# Patient Record
Sex: Female | Born: 1965 | Race: Black or African American | Hispanic: No | Marital: Married | Smoking: Never smoker
Health system: Southern US, Community
[De-identification: ages and names within clinical notes are randomized; demographics above are authoritative.]

## PROBLEM LIST (undated history)

## (undated) DIAGNOSIS — I10 Essential (primary) hypertension: Secondary | ICD-10-CM

## (undated) DIAGNOSIS — M1990 Unspecified osteoarthritis, unspecified site: Secondary | ICD-10-CM

## (undated) HISTORY — PX: OTHER SURGICAL HISTORY: SHX169

---

## 2002-01-23 ENCOUNTER — Encounter: Payer: Self-pay | Admitting: Emergency Medicine

## 2002-01-23 ENCOUNTER — Emergency Department (HOSPITAL_COMMUNITY): Admission: EM | Admit: 2002-01-23 | Discharge: 2002-01-23 | Payer: Self-pay | Admitting: Emergency Medicine

## 2002-01-26 ENCOUNTER — Emergency Department (HOSPITAL_COMMUNITY): Admission: EM | Admit: 2002-01-26 | Discharge: 2002-01-26 | Payer: Self-pay | Admitting: Emergency Medicine

## 2002-06-06 ENCOUNTER — Emergency Department (HOSPITAL_COMMUNITY): Admission: EM | Admit: 2002-06-06 | Discharge: 2002-06-07 | Payer: Self-pay | Admitting: Internal Medicine

## 2002-12-29 ENCOUNTER — Ambulatory Visit (HOSPITAL_COMMUNITY): Admission: RE | Admit: 2002-12-29 | Discharge: 2002-12-29 | Payer: Self-pay | Admitting: Family Medicine

## 2002-12-29 ENCOUNTER — Encounter: Payer: Self-pay | Admitting: Family Medicine

## 2003-01-20 ENCOUNTER — Ambulatory Visit (HOSPITAL_COMMUNITY): Admission: RE | Admit: 2003-01-20 | Discharge: 2003-01-20 | Payer: Self-pay | Admitting: Podiatry

## 2003-03-18 ENCOUNTER — Ambulatory Visit (HOSPITAL_COMMUNITY): Admission: RE | Admit: 2003-03-18 | Discharge: 2003-03-18 | Payer: Self-pay | Admitting: Family Medicine

## 2003-03-18 ENCOUNTER — Encounter: Payer: Self-pay | Admitting: Family Medicine

## 2006-01-28 ENCOUNTER — Ambulatory Visit (HOSPITAL_COMMUNITY): Admission: RE | Admit: 2006-01-28 | Discharge: 2006-01-28 | Payer: Self-pay | Admitting: Family Medicine

## 2013-11-12 ENCOUNTER — Ambulatory Visit (INDEPENDENT_AMBULATORY_CARE_PROVIDER_SITE_OTHER): Payer: Managed Care, Other (non HMO)

## 2013-11-12 ENCOUNTER — Ambulatory Visit (INDEPENDENT_AMBULATORY_CARE_PROVIDER_SITE_OTHER): Payer: Managed Care, Other (non HMO) | Admitting: Orthopedic Surgery

## 2013-11-12 ENCOUNTER — Encounter: Payer: Self-pay | Admitting: Orthopedic Surgery

## 2013-11-12 VITALS — BP 122/84 | Ht 63.0 in | Wt 197.0 lb

## 2013-11-12 DIAGNOSIS — M25519 Pain in unspecified shoulder: Secondary | ICD-10-CM

## 2013-11-12 DIAGNOSIS — M25511 Pain in right shoulder: Secondary | ICD-10-CM

## 2013-11-12 DIAGNOSIS — M778 Other enthesopathies, not elsewhere classified: Secondary | ICD-10-CM | POA: Insufficient documentation

## 2013-11-12 DIAGNOSIS — M67919 Unspecified disorder of synovium and tendon, unspecified shoulder: Secondary | ICD-10-CM

## 2013-11-12 MED ORDER — IBUPROFEN 800 MG PO TABS: 800.0000 mg | ORAL_TABLET | Freq: Three times a day (TID) | ORAL | Status: DC | PRN

## 2013-11-12 MED ORDER — HYDROCODONE-ACETAMINOPHEN 5-325 MG PO TABS: 1.0000 | ORAL_TABLET | Freq: Four times a day (QID) | ORAL | Status: DC | PRN

## 2013-11-12 NOTE — Patient Instructions (Signed)
Rest your shoulder Pick up Ibuprofen at pharmacy Joint Injection  Care After  Refer to this sheet in the next few days. These instructions provide you with information on caring for yourself after you have had a joint injection. Your caregiver also may give you more specific instructions. Your treatment has been planned according to current medical practices, but problems sometimes occur. Call your caregiver if you have any problems or questions after your procedure.  After any type of joint injection, it is not uncommon to experience:  Soreness, swelling, or bruising around the injection site.  Mild numbness, tingling, or weakness around the injection site caused by the numbing medicine used before or with the injection. It also is possible to experience the following effects associated with the specific agent after injection:  Iodine-based contrast agents:  Allergic reaction (itching, hives, widespread redness, and swelling beyond the injection site).  Corticosteroids (These effects are rare.):  Allergic reaction.  Increased blood sugar levels (If you have diabetes and you notice that your blood sugar levels have increased, notify your caregiver).  Increased blood pressure levels.  Mood swings.  Hyaluronic acid in the use of viscosupplementation.  Temporary heat or redness.  Temporary rash and itching.  Increased fluid accumulation in the injected joint. These effects all should resolve within a day after your procedure.  HOME CARE INSTRUCTIONS  Limit yourself to light activity the day of your procedure. Avoid lifting heavy objects, bending, stooping, or twisting.  Take prescription or over-the-counter pain medication as directed by your caregiver.  You may apply ice to your injection site to reduce pain and swelling the day of your procedure. Ice may be applied 3-4 times:  Put ice in a plastic bag.  Place a towel between your skin and the bag.  Leave the ice on for no longer than 15-20  minutes each time. SEEK IMMEDIATE MEDICAL CARE IF:  Pain and swelling get worse rather than better or extend beyond the injection site.  Numbness does not go away.  Blood or fluid continues to leak from the injection site.  You have chest pain.  You have swelling of your face or tongue.  You have trouble breathing or you become dizzy.  You develop a fever, chills, or severe tenderness at the injection site that last longer than 1 day. MAKE SURE YOU:  Understand these instructions.  Watch your condition.  Get help right away if you are not doing well or if you get worse. Document Released: 08/23/2011 Document Revised: 03/03/2012 Document Reviewed: 08/23/2011  [facility] Patient Information 2014 [place], [place].  Rotator Cuff Tendonitis  The rotator cuff is the collection of all the muscles and tendons (the supraspinatus, infraspinatus, subscapularis, and teres minor muscles and their tendons) that help your shoulder stay in place. This unit holds the head of the upper arm bone (humerus) in the cup (fossa) of the shoulder blade (scapula). Basically, it connects the arm to the shoulder. Tendinitis is a swelling and irritation of the tissue, called cord like structures (tendons) that connect muscle to bone. It usually is caused by overusing the joint involved. When the tissue surrounding a tendon (the synovium) becomes inflamed, it is called tenosynovitis. This also is often the result of overuse in people whose jobs require repetitive (over and over again) types of motion. HOME CARE INSTRUCTIONS   Use a sling or splint for as Renn Stille as directed by your caregiver until the pain decreases.  Apply ice to the injury for 15-20 minutes, 03-04 times  per day. Put the ice in a plastic bag and place a towel between the bag of ice and your skin.  Try to avoid use other than gentle range of motion while your shoulder is painful. Use and exercise only as directed by your caregiver. Stop exercises or range of  motion if pain or discomfort increases, unless directed otherwise by your caregiver.  Only take over-the-counter or prescription medicines for pain, discomfort, or fever as directed by your caregiver.  If you were give a shoulder sling and straps (immobilizer), do not remove it except as directed, or until you see a caregiver for a follow-up examination. If you need to remove it, move your arm as little as possible or as directed.  You may want to sleep on several pillows at night to lessen swelling and pain. SEEK IMMEDIATE MEDICAL CARE IF:   Pain in your shoulder increases or new pain develops in your arm, hand, or fingers and is not relieved with medications.  You develop new, unexplained symptoms, especially increased numbness in the hands or loss of strength, or you develop any worsening of the problems which brought you in for care.  Your arm, hand, or fingers are numb or tingling.  Your arm, hand, or fingers are swollen, painful, or turn white or blue. Document Released: 03/01/2004 Document Revised: 03/03/2012 Document Reviewed: 07/22/2013 [facility] Patient Information 2014 [place], [place].

## 2013-11-12 NOTE — Progress Notes (Signed)
Patient ID: Jennifer Ellis, female   DOB: November 18, 1966, 47 y.o.   MRN: [id number]  Chief Complaint  Patient presents with  . 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 Her symptoms started gradually no trauma. She complains a 5/10 constant pain in the right shoulder even at work although there was no work injury. She has more pain in the evening and early in the morning and really bothers her when she settles down for the evening. She does not have numbness or tingling in the arm the patient complains of pain radiating to the elbow but not below the definitive defining it as starting in the shoulder denies neck pain or stiffness  History of constipation muscle pain all other systems were reviewed and were normal  Her medical history is positive for having had a cesarean section no major medical problems or medications social history she is separated does not smoke or drink  She is a well-developed well-nourished female awake alert and oriented x3 mood and affect are normal general appearance is normal she's a mature without assistive devices and there is no evidence of a limp  She has good pulses and normal sensation in both arms with normal lymph lymph nodes bilaterally in the cervical and axillary regions reflexes are 2+ and equal. Left shoulder full range of motion stability tests were normal motor function was normal skin was intact and inspection showed no swelling or tenderness  Right shoulder area acromial tenderness ankle range of motion active and passive with limitations of motion 90 flexion abduction active and 120 flexion with passive range of motion which produces pain and a positive impingement sign with a weak rotator cuff skin is intact and normal  X-ray was negative for fracture or calcification.  Impression rotator cuff  tendinitis  Recommend Anti-inflammatory Motrin 800 mg 3 times a day Analgesic Norco 5 mg every 6 when necessary pain #30 Right subacromial injection I advised 10 days of rest with the patient suggested at work she will get some rest over the Thanksgiving break  Followup as needed  Shoulder Injection Procedure Note   Pre-operative Diagnosis: right  RC Syndrome  Post-operative Diagnosis: same  Indications: pain   Anesthesia: ethyl chloride   Procedure Details   Verbal consent was obtained for the procedure. The shoulder was prepped withalcohol and the skin was anesthetized. A 20 gauge needle was advanced into the subacromial space through posterior approach without difficulty  The space was then injected with 3 ml 1% lidocaine and 1 ml of depomedrol. The injection site was cleansed with isopropyl alcohol and a dressing was applied.  Complications:  None; patient tolerated the procedure well.

## 2014-08-06 ENCOUNTER — Emergency Department (HOSPITAL_COMMUNITY)
Admission: EM | Admit: 2014-08-06 | Discharge: 2014-08-06 | Disposition: A | Discharge status: Home / Self Care | Payer: Managed Care, Other (non HMO) | Attending: Emergency Medicine | Admitting: Emergency Medicine

## 2014-08-06 ENCOUNTER — Encounter (HOSPITAL_COMMUNITY): Payer: Self-pay | Admitting: Emergency Medicine

## 2014-08-06 DIAGNOSIS — R110 Nausea: Secondary | ICD-10-CM | POA: Diagnosis not present

## 2014-08-06 DIAGNOSIS — R1084 Generalized abdominal pain: Secondary | ICD-10-CM | POA: Insufficient documentation

## 2014-08-06 DIAGNOSIS — Z3202 Encounter for pregnancy test, result negative: Secondary | ICD-10-CM | POA: Diagnosis not present

## 2014-08-06 LAB — WET PREP, GENITAL
Trich, Wet Prep: NONE SEEN
WBC WET PREP: NONE SEEN
YEAST WET PREP: NONE SEEN

## 2014-08-06 LAB — CBC WITH DIFFERENTIAL/PLATELET
BASOS ABS: 0.1 10*3/uL (ref 0.0–0.1)
BASOS PCT: 1 % (ref 0–1)
Eosinophils Absolute: 0.3 10*3/uL (ref 0.0–0.7)
Eosinophils Relative: 5 % (ref 0–5)
HCT: 36.5 % (ref 36.0–46.0)
Hemoglobin: 12.1 g/dL (ref 12.0–15.0)
LYMPHS PCT: 34 % (ref 12–46)
Lymphs Abs: 2.4 10*3/uL (ref 0.7–4.0)
MCH: 29.9 pg (ref 26.0–34.0)
MCHC: 33.2 g/dL (ref 30.0–36.0)
MCV: 90.1 fL (ref 78.0–100.0)
Monocytes Absolute: 0.5 10*3/uL (ref 0.1–1.0)
Monocytes Relative: 7 % (ref 3–12)
NEUTROS ABS: 3.8 10*3/uL (ref 1.7–7.7)
Neutrophils Relative %: 53 % (ref 43–77)
PLATELETS: 277 10*3/uL (ref 150–400)
RBC: 4.05 MIL/uL (ref 3.87–5.11)
RDW: 12.9 % (ref 11.5–15.5)
WBC: 7.1 10*3/uL (ref 4.0–10.5)

## 2014-08-06 LAB — URINALYSIS, ROUTINE W REFLEX MICROSCOPIC
Bilirubin Urine: NEGATIVE
GLUCOSE, UA: NEGATIVE mg/dL
Hgb urine dipstick: NEGATIVE
KETONES UR: NEGATIVE mg/dL
LEUKOCYTES UA: NEGATIVE
NITRITE: NEGATIVE
PH: 6 (ref 5.0–8.0)
Protein, ur: NEGATIVE mg/dL
SPECIFIC GRAVITY, URINE: 1.022 (ref 1.005–1.030)
Urobilinogen, UA: 0.2 mg/dL (ref 0.0–1.0)

## 2014-08-06 LAB — PREGNANCY, URINE: PREG TEST UR: NEGATIVE

## 2014-08-06 LAB — COMPREHENSIVE METABOLIC PANEL
ALBUMIN: 3.7 g/dL (ref 3.5–5.2)
ALT: 12 U/L (ref 0–35)
AST: 15 U/L (ref 0–37)
Alkaline Phosphatase: 70 U/L (ref 39–117)
Anion gap: 11 (ref 5–15)
BUN: 14 mg/dL (ref 6–23)
CHLORIDE: 104 meq/L (ref 96–112)
CO2: 28 meq/L (ref 19–32)
Calcium: 9.5 mg/dL (ref 8.4–10.5)
Creatinine, Ser: 0.85 mg/dL (ref 0.50–1.10)
GFR calc Af Amer: >90 mL/min (ref >90)
GFR calc non Af Amer: 81 mL/min — ABNORMAL LOW (ref >90)
Glucose, Bld: 89 mg/dL (ref 70–99)
Potassium: 4.1 mEq/L (ref 3.7–5.3)
SODIUM: 143 meq/L (ref 137–147)
Total Bilirubin: 0.3 mg/dL (ref 0.3–1.2)
Total Protein: 7.8 g/dL (ref 6.0–8.3)

## 2014-08-06 LAB — LIPASE, BLOOD: Lipase: 23 U/L (ref 11–59)

## 2014-08-06 MED ORDER — OXYCODONE-ACETAMINOPHEN 5-325 MG PO TABS
1.0000 | ORAL_TABLET | Freq: Once | ORAL | Status: AC
  Administered 2014-08-06: 1 via ORAL
  Filled 2014-08-06: qty 1

## 2014-08-06 MED ORDER — GI COCKTAIL ~~LOC~~
30.0000 mL | Freq: Once | ORAL | Status: AC
  Administered 2014-08-06: 30 mL via ORAL
  Filled 2014-08-06: qty 30

## 2014-08-06 NOTE — ED Provider Notes (Signed)
CSN: [id number]     Arrival date & time 08/06/14  1734 History   First MD Initiated Contact with Patient 08/06/14 2044     Chief Complaint  Patient presents with  . Abdominal Pain   Jennifer Ellis is a 48 yo AAF w/PMH of 3 C-sxns who presents today with complaints of generalized abdominal pain and decreased appetite. Abdominal pain is located in the middle of her stomach and feels like a knot. Last bowel movement was yesterday and was normal. No diarrhea, no dark tarry stool. Since then has been constant today, moderate in severity, no radiation. She also has lower abdominal pain above the inguinal areas bilaterally. Patient has had decreased appetite but no nausea or vomiting. Able to eat and drink normally. Denies any urinary symptoms. No history of STI's. Patient says she's had a little bit of acid reflux in the past  She denies CP, SOB, fever, chills, V, diarrhea, constipation, hematemesis, dysuria, hematuria, sick contacts, or recent travel.   (Consider location/radiation/quality/duration/timing/severity/associated sxs/prior Treatment) Patient is a 48 y.o. female presenting with abdominal pain. The history is provided by the patient.  Abdominal Pain Pain location:  Periumbilical Pain quality comment:  Knot-like Pain radiates to:  Does not radiate Pain severity:  Moderate Onset quality:  Sudden Duration:  1 day Timing:  Constant Progression:  Unchanged Chronicity:  New Context: not alcohol use, not diet changes, not retching, not sick contacts and not suspicious food intake   Relieved by:  Nothing Worsened by:  Nothing tried Ineffective treatments:  None tried Associated symptoms: no belching, no chest pain, no chills, no constipation, no diarrhea, no dysuria, no fatigue, no fever, no hematemesis, no hematochezia, no hematuria, no melena, no nausea, no shortness of breath, no vaginal bleeding, no vaginal discharge and no vomiting     History reviewed. No pertinent past medical  history. History reviewed. No pertinent past surgical history. No family history on file. History  Substance Use Topics  . Smoking status: Never Smoker   . Smokeless tobacco: Not on file  . Alcohol Use: No   OB History   Grav Para Term Preterm Abortions TAB SAB Ect Mult Living                 Review of Systems  Constitutional: Negative for fever, chills and fatigue.  Respiratory: Negative for shortness of breath.   Cardiovascular: Negative for chest pain, palpitations and leg swelling.  Gastrointestinal: Positive for abdominal pain. Negative for nausea, vomiting, diarrhea, constipation, blood in stool, melena, hematochezia, abdominal distention, rectal pain and hematemesis.  Genitourinary: Negative for dysuria, frequency, hematuria, flank pain, decreased urine volume, vaginal bleeding, vaginal discharge, difficulty urinating, genital sores, vaginal pain, menstrual problem and pelvic pain.  Neurological: Negative for dizziness, speech difficulty, light-headedness and headaches.  All other systems reviewed and are negative.     Allergies  Review of patient's allergies indicates no known allergies.  Home Medications   Prior to Admission medications   Medication Sig Start Date End Date Taking? Authorizing Provider  ibuprofen (ADVIL,MOTRIN) 200 MG tablet Take 400 mg by mouth every 6 (six) hours as needed for moderate pain.   Yes Historical Provider, MD   BP 154/91  Pulse 64  Temp(Src) 98.7 F (37.1 C) (Oral)  Resp 18  Ht 5\' 3"  (1.6 m)  Wt 204 lb (92.534 kg)  BMI 36.15 kg/m2  SpO2 98%  LMP 05/06/2014 Physical Exam  Nursing note and vitals reviewed. Constitutional: She appears well-developed and well-nourished. No distress.  HENT:  Head: Normocephalic and atraumatic.  Cardiovascular: Normal rate, regular rhythm, normal heart sounds and intact distal pulses.  Exam reveals no gallop and no friction rub.   No murmur heard. Pulmonary/Chest: Effort normal and breath sounds  normal. No respiratory distress. She has no wheezes. She has no rales. She exhibits no tenderness.  Abdominal: Soft. Bowel sounds are normal. She exhibits no distension and no mass. There is tenderness (mild, epigastric, periumbilica, and lower abd). There is no rebound and no guarding.  Lymphadenopathy:    She has no cervical adenopathy.  Skin: Skin is warm and dry. She is not diaphoretic.    ED Course  Procedures (including critical care time) Labs Review Labs Reviewed  WET PREP, GENITAL - Abnormal; Notable for the following:    Clue Cells Wet Prep HPF POC FEW (*)    All other components within normal limits  COMPREHENSIVE METABOLIC PANEL - Abnormal; Notable for the following:    GFR calc non Af Amer 81 (*)    All other components within normal limits  URINALYSIS, ROUTINE W REFLEX MICROSCOPIC - Abnormal; Notable for the following:    APPearance CLOUDY (*)    All other components within normal limits  GC/CHLAMYDIA PROBE AMP  CBC WITH DIFFERENTIAL  LIPASE, BLOOD  PREGNANCY, URINE    Imaging Review No results found.   EKG Interpretation None      MDM   48 yo AAF w/abd pain. Please see HPI for details. On exam, pt in NAD, AFVSS. Abdominal exam very benign with generalized tenderness located mostly in the epigastric, periumbilical, lower abdomen. Will obtain CBC, CMP, UA, pelvic exam with wet prep and GC/Chlamydia, lipase, UA. Patient given GI cocktail and Percocet for pain. DDx includes bowel gas, GERD, UTI, and less likely, pancreatitis or ectopic pregnancy. History and exam not consistent with ovarian torsion, cholecystitis, appendicitis, kidney stone, or SBO. Therefore no imaging obtained.  Pelvic exam shows physiologic discharge, no CMT tenderness, no masses and adnexa area. CBC, CMP, UA within normal limits with no signs of infection. Lipase negative for pancreatitis.  Wet prep returned with only a few clue cells but otherwise normal. GC/chlamydia still pending. At this  time patient feels comfortable going home. Pain well-controlled after Percocet. She was given a few pills of Norco and Zofran for over the weekend. Encouraged her to follow up PCP next week. Strict return precautions include severe pain, especially if accompanied with severe nausea/vomiting, and PO intolerance.  Final diagnoses:  Abdominal pain, generalized  Nausea    Pt was seen under the supervision of Dr. Rubin Payor.     Rachelle Hora, MD 08/07/14 [phone]

## 2014-08-06 NOTE — ED Notes (Signed)
abd pain and swelling since yesterday no nv or diarrhea.  lmp may

## 2014-08-06 NOTE — ED Notes (Signed)
Pt reports she began having upper abdominal pain since at work yesterday.  "feels like I got a knot right there"   Also reports onset of heartburn lately which is not normal for her.  Denies n/v/d.

## 2014-08-06 NOTE — Discharge Instructions (Signed)
Abdominal Pain, Women °Abdominal (stomach, pelvic, or belly) pain can be caused by many things. It is important to tell your doctor: °· The location of the pain. °· Does it come and go or is it present all the time? °· Are there things that start the pain (eating certain foods, exercise)? °· Are there other symptoms associated with the pain (fever, nausea, vomiting, diarrhea)? °All of this is helpful to know when trying to find the cause of the pain. °CAUSES  °· Stomach: virus or bacteria infection, or ulcer. °· Intestine: appendicitis (inflamed appendix), regional ileitis (Crohn's disease), ulcerative colitis (inflamed colon), irritable bowel syndrome, diverticulitis (inflamed diverticulum of the colon), or cancer of the stomach or intestine. °· Gallbladder disease or stones in the gallbladder. °· Kidney disease, kidney stones, or infection. °· Pancreas infection or cancer. °· Fibromyalgia (pain disorder). °· Diseases of the female organs: °¨ Uterus: fibroid (non-cancerous) tumors or infection. °¨ Fallopian tubes: infection or tubal pregnancy. °¨ Ovary: cysts or tumors. °¨ Pelvic adhesions (scar tissue). °¨ Endometriosis (uterus lining tissue growing in the pelvis and on the pelvic organs). °¨ Pelvic congestion syndrome (female organs filling up with blood just before the menstrual period). °¨ Pain with the menstrual period. °¨ Pain with ovulation (producing an egg). °¨ Pain with an IUD (intrauterine device, birth control) in the uterus. °¨ Cancer of the female organs. °· Functional pain (pain not caused by a disease, may improve without treatment). °· Psychological pain. °· Depression. °DIAGNOSIS  °Your doctor will decide the seriousness of your pain by doing an examination. °· Blood tests. °· X-rays. °· Ultrasound. °· CT scan (computed tomography, special type of X-ray). °· MRI (magnetic resonance imaging). °· Cultures, for infection. °· Barium enema (dye inserted in the large intestine, to better view it with  X-rays). °· Colonoscopy (looking in intestine with a lighted tube). °· Laparoscopy (minor surgery, looking in abdomen with a lighted tube). °· Major abdominal exploratory surgery (looking in abdomen with a large incision). °TREATMENT  °The treatment will depend on the cause of the pain.  °· Many cases can be observed and treated at home. °· Over-the-counter medicines recommended by your caregiver. °· Prescription medicine. °· Antibiotics, for infection. °· Birth control pills, for painful periods or for ovulation pain. °· Hormone treatment, for endometriosis. °· Nerve blocking injections. °· Physical therapy. °· Antidepressants. °· Counseling with a psychologist or psychiatrist. °· Minor or major surgery. °HOME CARE INSTRUCTIONS  °· Do not take laxatives, unless directed by your caregiver. °· Take over-the-counter pain medicine only if ordered by your caregiver. Do not take aspirin because it can cause an upset stomach or bleeding. °· Try a clear liquid diet (broth or water) as ordered by your caregiver. Slowly move to a bland diet, as tolerated, if the pain is related to the stomach or intestine. °· Have a thermometer and take your temperature several times a day, and record it. °· Bed rest and sleep, if it helps the pain. °· Avoid sexual intercourse, if it causes pain. °· Avoid stressful situations. °· Keep your follow-up appointments and tests, as your caregiver orders. °· If the pain does not go away with medicine or surgery, you may try: °¨ Acupuncture. °¨ Relaxation exercises (yoga, meditation). °¨ Group therapy. °¨ Counseling. °SEEK MEDICAL CARE IF:  °· You notice certain foods cause stomach pain. °· Your home care treatment is not helping your pain. °· You need stronger pain medicine. °· You want your IUD removed. °· You feel faint or   lightheaded. °· You develop nausea and vomiting. °· You develop a rash. °· You are having side effects or an allergy to your medicine. °SEEK IMMEDIATE MEDICAL CARE IF:  °· Your  pain does not go away or gets worse. °· You have a fever. °· Your pain is felt only in portions of the abdomen. The right side could possibly be appendicitis. The left lower portion of the abdomen could be colitis or diverticulitis. °· You are passing blood in your stools (bright red or black tarry stools, with or without vomiting). °· You have blood in your urine. °· You develop chills, with or without a fever. °· You pass out. °MAKE SURE YOU:  °· Understand these instructions. °· Will watch your condition. °· Will get help right away if you are not doing well or get worse. °Document Released: 10/07/2007 Document Revised: 04/26/2014 Document Reviewed: 10/27/2009 °[employer]® Patient Information ©2015 [employer]. This information is not intended to replace advice given to you by your health care provider. Make sure you discuss any questions you have with your health care provider. ° °

## 2014-08-07 LAB — GC/CHLAMYDIA PROBE AMP
CT PROBE, AMP APTIMA: NEGATIVE
GC PROBE AMP APTIMA: NEGATIVE

## 2014-08-07 NOTE — ED Provider Notes (Signed)
I saw and evaluated the patient, reviewed the resident's note and I agree with the findings and plan.   EKG Interpretation None     Patient abdominal pain. Lab work reassuring. Doubt severe pathology at this time. Will discharge  Juliet Rudeathan R. Rubin PayorPickering, MD 08/07/14 1445

## 2016-07-08 ENCOUNTER — Emergency Department (HOSPITAL_COMMUNITY)
Admission: EM | Admit: 2016-07-08 | Discharge: 2016-07-08 | Disposition: A | Discharge status: Home / Self Care | Payer: Managed Care, Other (non HMO) | Attending: Emergency Medicine | Admitting: Emergency Medicine

## 2016-07-08 ENCOUNTER — Encounter (HOSPITAL_COMMUNITY): Payer: Self-pay | Admitting: *Deleted

## 2016-07-08 DIAGNOSIS — M25552 Pain in left hip: Secondary | ICD-10-CM | POA: Diagnosis present

## 2016-07-08 DIAGNOSIS — M5442 Lumbago with sciatica, left side: Secondary | ICD-10-CM | POA: Diagnosis not present

## 2016-07-08 DIAGNOSIS — M5432 Sciatica, left side: Secondary | ICD-10-CM

## 2016-07-08 MED ORDER — KETOROLAC TROMETHAMINE 60 MG/2ML IM SOLN
60.0000 mg | Freq: Once | INTRAMUSCULAR | Status: AC
  Administered 2016-07-08: 60 mg via INTRAMUSCULAR
  Filled 2016-07-08: qty 2

## 2016-07-08 MED ORDER — DEXAMETHASONE SODIUM PHOSPHATE 10 MG/ML IJ SOLN
10.0000 mg | Freq: Once | INTRAMUSCULAR | Status: AC
  Administered 2016-07-08: 10 mg via INTRAMUSCULAR
  Filled 2016-07-08: qty 1

## 2016-07-08 MED ORDER — CYCLOBENZAPRINE HCL 10 MG PO TABS: 10.0000 mg | ORAL_TABLET | Freq: Two times a day (BID) | ORAL | Status: DC | PRN

## 2016-07-08 MED ORDER — PREDNISONE 20 MG PO TABS: ORAL_TABLET | ORAL | Status: DC

## 2016-07-08 MED ORDER — TRAMADOL HCL 50 MG PO TABS: 50.0000 mg | ORAL_TABLET | Freq: Four times a day (QID) | ORAL | Status: DC | PRN

## 2016-07-08 NOTE — ED Provider Notes (Signed)
CSN: [id number]     Arrival date & time 07/08/16  0636 History   First MD Initiated Contact with Patient 07/08/16 [id number]     Chief Complaint  Patient presents with  . left leg pain      (Consider location/radiation/quality/duration/timing/severity/associated sxs/prior Treatment) HPI Comments: Patient presents to the ER for evaluation of pain in her left hip that radiates down her left leg. Symptoms have been present for several days. Patient denies injury. She reports pain with range of motion of the hip but has not noticed any weakness of her lower extremities. There is no numbness or tingling. Patient denies change in bowel or bladder function.   History reviewed. No pertinent past medical history. Past Surgical History  Procedure Laterality Date  . Cesarean section    . Tummy tuck     History reviewed. No pertinent family history. Social History  Substance Use Topics  . Smoking status: Never Smoker   . Smokeless tobacco: None  . Alcohol Use: No   OB History    No data available     Review of Systems  Musculoskeletal: Positive for back pain.  All other systems reviewed and are negative.     Allergies  Review of patient's allergies indicates no known allergies.  Home Medications   Prior to Admission medications   Medication Sig Start Date End Date Taking? Authorizing Provider  cyclobenzaprine (FLEXERIL) 10 MG tablet Take 1 tablet (10 mg total) by mouth 2 (two) times daily as needed for muscle spasms. 07/08/16   Gilda Creasehristopher J Angeline Trick, MD  ibuprofen (ADVIL,MOTRIN) 200 MG tablet Take 400 mg by mouth every 6 (six) hours as needed for moderate pain.    Historical Provider, MD  predniSONE (DELTASONE) 20 MG tablet 3 tabs po daily x 3 days, then 2 tabs x 3 days, then 1.5 tabs x 3 days, then 1 tab x 3 days, then 0.5 tabs x 3 days 07/08/16   Gilda Creasehristopher J Tryphena Perkovich, MD  traMADol (ULTRAM) 50 MG tablet Take 1 tablet (50 mg total) by mouth every 6 (six) hours as needed. 07/08/16    Gilda Creasehristopher J Mell Mellott, MD   BP 155/98 mmHg  Pulse 70  Temp(Src) 97.7 F (36.5 C) (Oral)  Resp 20  Ht 5\' 3"  (1.6 m)  Wt 199 lb (90.266 kg)  BMI 35.26 kg/m2  SpO2 100% Physical Exam  Constitutional: She is oriented to person, place, and time. She appears well-developed and well-nourished. No distress.  HENT:  Head: Normocephalic and atraumatic.  Right Ear: Hearing normal.  Left Ear: Hearing normal.  Nose: Nose normal.  Mouth/Throat: Oropharynx is clear and moist and mucous membranes are normal.  Eyes: Conjunctivae and EOM are normal. Pupils are equal, round, and reactive to light.  Neck: Normal range of motion. Neck supple.  Cardiovascular: Regular rhythm, S1 normal and S2 normal.  Exam reveals no gallop and no friction rub.   No murmur heard. Pulmonary/Chest: Effort normal and breath sounds normal. No respiratory distress. She exhibits no tenderness.  Abdominal: Soft. Normal appearance and bowel sounds are normal. There is no hepatosplenomegaly. There is no tenderness. There is no rebound, no guarding, no tenderness at McBurney's point and negative Murphy's sign. No hernia.  Musculoskeletal: Normal range of motion.  Neurological: She is alert and oriented to person, place, and time. She has normal strength. No cranial nerve deficit or sensory deficit. Coordination normal. GCS eye subscore is 4. GCS verbal subscore is 5. GCS motor subscore is 6.  Pain with straight leg  raises left side  Skin: Skin is warm, dry and intact. No rash noted. No cyanosis.  Psychiatric: She has a normal mood and affect. Her speech is normal and behavior is normal. Thought content normal.  Nursing note and vitals reviewed.   ED Course  Procedures (including critical care time) Labs Review Labs Reviewed - No data to display  Imaging Review No results found. I have personally reviewed and evaluated these images and lab results as part of my medical decision-making.   EKG Interpretation None       MDM   Final diagnoses:  Sciatica of left side    Patient presents to the ER with back pain radiating to the leg. Examination reveals back tenderness without any associated neurologic findings. Patient's strength, sensation and reflexes were normal. There is no evidence of saddle anesthesia. Patient does not have a foot drop. Patient has not experienced any change in bowel or bladder function. As such, patient did not require any imaging or further studies. Patient was treated with analgesia.    Gilda Crease, MD 07/09/16 0003

## 2016-07-08 NOTE — ED Notes (Signed)
Pt c/o pain in her left hip taht radiates down her left leg for "several" days.

## 2016-07-08 NOTE — Discharge Instructions (Signed)

## 2019-02-10 DIAGNOSIS — Z6835 Body mass index (BMI) 35.0-35.9, adult: Secondary | ICD-10-CM | POA: Insufficient documentation

## 2020-03-24 DIAGNOSIS — H8110 Benign paroxysmal vertigo, unspecified ear: Secondary | ICD-10-CM | POA: Insufficient documentation

## 2020-03-24 DIAGNOSIS — M353 Polymyalgia rheumatica: Secondary | ICD-10-CM | POA: Insufficient documentation

## 2020-08-12 ENCOUNTER — Ambulatory Visit: Payer: Self-pay | Admitting: Family Medicine

## 2020-08-12 ENCOUNTER — Ambulatory Visit: Payer: Self-pay

## 2020-08-12 ENCOUNTER — Encounter: Payer: Self-pay | Admitting: Physician Assistant

## 2020-08-12 ENCOUNTER — Ambulatory Visit: Payer: BC Managed Care – PPO | Admitting: Orthopaedic Surgery

## 2020-08-12 VITALS — Ht 65.0 in | Wt 212.0 lb

## 2020-08-12 DIAGNOSIS — M25511 Pain in right shoulder: Secondary | ICD-10-CM

## 2020-08-12 NOTE — Progress Notes (Signed)
° °  Office Visit Note   Patient: Jennifer Ellis           Date of Birth: 1966/09/14           MRN: [id number] Visit Date: 08/12/2020              Requested by: No referring provider defined for this encounter. PCP: Patient, No Pcp Per   Assessment & Plan: Visit Diagnoses:  1. Acute pain of right shoulder     Plan: Impression is right shoulder subacromial bursitis.  Today, I injected the right shoulder subacromial space with cortisone.  If she is not significantly improved over the next several weeks she will call us and we will get an MRI of the right shoulder to assess her rotator cuff.  Follow-Up Instructions: No follow-ups on file.   Orders:  Orders Placed This Encounter  Procedures   XR Shoulder Right   No orders of the defined types were placed in this encounter.     Procedures: Large Joint Inj: R subacromial bursa on 08/13/2020 9:06 AM Indications: pain Details: 22 G needle  Arthrogram: No  Medications: 3 mL lidocaine 1 %; 3 mL bupivacaine 0.5 %; 40 mg methylPREDNISolone acetate 40 MG/ML Outcome: tolerated well, no immediate complications Consent was given by the patient. Patient was prepped and draped in the usual sterile fashion.       Clinical Data: No additional findings.   Subjective: Chief Complaint  Patient presents with   Right Shoulder - Pain    HPI patient is a pleasant 54 year old female who comes in today with right shoulder pain.  This has been ongoing for the past 6 months without a specific injury or change in activity.  She does note very similar pain several years ago for which she had a cortisone injection which helped until recently.  The pain is to the top of her shoulder and radiates into the deltoid down to the hand.  Any movement of the shoulder seems to aggravate her symptoms.  She also has pain sleeping on the right side.  She denies any numbness, tingling or burning.  No previous MRI or surgical intervention to the right  shoulder.  Review of Systems as detailed in HPI.  All others reviewed and are negative.   Objective: Vital Signs: Ht 5\' 5"  (1.651 m)    Wt 212 lb (96.2 kg)    BMI 35.28 kg/m   Physical Exam well-developed well-nourished female no acute distress.  Alert oriented x3.  Ortho Exam examination of the right shoulder reveals approximately 75 degrees of forward flexion.  She can internally rotate to L5.  Markedly positive empty can and cross body adduction.  4 out of 5 strength throughout.  She is neurovascular intact distally.  Specialty Comments:  No specialty comments available.  Imaging: XR Shoulder Right  Result Date: 08/12/2020 No acute or structural abnormalities    PMFS History: There are no problems to display for this patient.  History reviewed. No pertinent past medical history.  History reviewed. No pertinent family history.  History reviewed. No pertinent surgical history. Social History   Occupational History   Not on file  Tobacco Use   Smoking status: Never Smoker   Smokeless tobacco: Never Used  Substance and Sexual Activity   Alcohol use: Not on file   Drug use: Not on file   Sexual activity: Not on file

## 2020-08-13 DIAGNOSIS — M25511 Pain in right shoulder: Secondary | ICD-10-CM

## 2020-08-13 MED ORDER — LIDOCAINE HCL 1 % IJ SOLN
3.0000 mL | INTRAMUSCULAR | Status: AC | PRN
  Administered 2020-08-13: 3 mL

## 2020-08-13 MED ORDER — METHYLPREDNISOLONE ACETATE 40 MG/ML IJ SUSP
40.0000 mg | INTRAMUSCULAR | Status: AC | PRN
  Administered 2020-08-13: 40 mg via INTRA_ARTICULAR

## 2020-08-13 MED ORDER — BUPIVACAINE HCL 0.5 % IJ SOLN
3.0000 mL | INTRAMUSCULAR | Status: AC | PRN
  Administered 2020-08-13: 3 mL via INTRA_ARTICULAR

## 2020-08-31 ENCOUNTER — Encounter (HOSPITAL_COMMUNITY): Payer: Self-pay | Admitting: *Deleted

## 2021-05-16 ENCOUNTER — Other Ambulatory Visit: Payer: Self-pay

## 2021-05-16 ENCOUNTER — Encounter: Payer: Self-pay | Admitting: Orthopaedic Surgery

## 2021-05-16 ENCOUNTER — Ambulatory Visit (INDEPENDENT_AMBULATORY_CARE_PROVIDER_SITE_OTHER): Payer: BC Managed Care – PPO | Admitting: Orthopaedic Surgery

## 2021-05-16 VITALS — BP 188/124 | HR 70 | Ht 65.0 in | Wt 214.0 lb

## 2021-05-16 DIAGNOSIS — M25562 Pain in left knee: Secondary | ICD-10-CM | POA: Diagnosis not present

## 2021-05-16 DIAGNOSIS — G8929 Other chronic pain: Secondary | ICD-10-CM | POA: Diagnosis not present

## 2021-05-16 NOTE — Progress Notes (Signed)
Subjective:    Patient ID: Jennifer Ellis, female    DOB: Apr 08, 1966, 55 y.o.   MRN: [id number]  HPI She has had pain in the left knee since March of this year. She has popping and swelling and also giving way.  She was seen April 03, 2021 at [facility] and again on 04-27-21.  I have reviewed the notes. She has been given Celebrex and has had injection in the knee which helped only slightly.  Her giving way is worse, her pain is worse.  She cannot walk long distances.  It hurts at the end of her shift.  She has no direct trauma, no redness.  She is tired of hurting.   Review of Systems  Constitutional: Positive for activity change.  Musculoskeletal: Positive for arthralgias, gait problem and joint swelling.  All other systems reviewed and are negative.  For Review of Systems, all other systems reviewed and are negative.  The following is a summary of the past history medically, past history surgically, known current medicines, social history and family history.  This information is gathered electronically by the computer from prior information and documentation.  I review this each visit and have found including this information at this point in the chart is beneficial and informative.   History reviewed. No pertinent past medical history.  Past Surgical History:  Procedure Laterality Date  . CESAREAN SECTION    . tummy tuck      Current Outpatient Medications on File Prior to Visit  Medication Sig Dispense Refill  . celecoxib (CELEBREX) 200 MG capsule Take 200 mg by mouth daily.    Marland Kitchen ibuprofen (ADVIL,MOTRIN) 200 MG tablet Take 400 mg by mouth every 6 (six) hours as needed for moderate pain.     No current facility-administered medications on file prior to visit.    Social History   Socioeconomic History  . Marital status: Married    Spouse name: Not on file  . Number of children: Not on file  . Years of education: Not on file  . Highest education level: Not on file   Occupational History  . Not on file  Tobacco Use  . Smoking status: Never Smoker  . Smokeless tobacco: Never Used  Substance and Sexual Activity  . Alcohol use: No  . Drug use: No  . Sexual activity: Not on file  Other Topics Concern  . Not on file  Social History Narrative   ** Merged History Encounter **       Social Determinants of Health   Financial Resource Strain: Not on file  Food Insecurity: Not on file  Transportation Needs: Not on file  Physical Activity: Not on file  Stress: Not on file  Social Connections: Not on file  Intimate Partner Violence: Not on file    History reviewed. No pertinent family history.  BP (!) 188/124   Pulse 70   Ht 5\' 5"  (1.651 m)   Wt 214 lb (97.1 kg)   BMI 35.61 kg/m   Body mass index is 35.61 kg/m.      Objective:   Physical Exam Vitals and nursing note reviewed. Exam conducted with a chaperone present.  Constitutional:      Appearance: She is well-developed.  HENT:     Head: Normocephalic and atraumatic.  Eyes:     Conjunctiva/sclera: Conjunctivae normal.     Pupils: Pupils are equal, round, and reactive to light.  Cardiovascular:     Rate and Rhythm: Normal rate and regular  rhythm.  Pulmonary:     Effort: Pulmonary effort is normal.  Abdominal:     Palpations: Abdomen is soft.  Musculoskeletal:     Cervical back: Normal range of motion and neck supple.       Legs:  Skin:    General: Skin is warm and dry.  Neurological:     Mental Status: She is alert and oriented to person, place, and time.     Cranial Nerves: No cranial nerve deficit.     Motor: No abnormal muscle tone.     Coordination: Coordination normal.     Deep Tendon Reflexes: Reflexes are normal and symmetric. Reflexes normal.  Psychiatric:        Behavior: Behavior normal.        Thought Content: Thought content normal.        Judgment: Judgment normal.    I have independently reviewed and interpreted x-rays of this patient done at another  site by another physician or qualified health professional.  I have reviewed the X-rays from [facility].  She has medial narrowing of the left knee but no loose body or fracture.       Assessment & Plan:   Encounter Diagnosis  Name Primary?  . Chronic pain of left knee Yes   I will get MRI of the left knee.  I am concerned about meniscus tear.  Return in three weeks.  Call if any problem.  Precautions discussed.   Electronically Signed Darreld Mclean, MD 5/24/20221:57 PM

## 2021-06-06 ENCOUNTER — Ambulatory Visit: Payer: BC Managed Care – PPO | Admitting: Orthopaedic Surgery

## 2021-06-08 ENCOUNTER — Other Ambulatory Visit: Payer: Self-pay

## 2021-06-08 ENCOUNTER — Ambulatory Visit: Payer: BC Managed Care – PPO | Admitting: Orthopaedic Surgery

## 2021-06-08 ENCOUNTER — Encounter: Payer: Self-pay | Admitting: Orthopaedic Surgery

## 2021-06-08 VITALS — BP 154/103 | HR 87 | Ht 65.0 in | Wt 213.0 lb

## 2021-06-08 DIAGNOSIS — S83242D Other tear of medial meniscus, current injury, left knee, subsequent encounter: Secondary | ICD-10-CM | POA: Diagnosis not present

## 2021-06-08 NOTE — Patient Instructions (Signed)
Return to see Dr. Ilda Foil. Jennifer Ellis

## 2021-06-08 NOTE — Progress Notes (Signed)
My knee hurts.  She has left knee pain, giving way, swelling.  She had MRI of the left knee showing: Near complete tear of medial meniscus posterior root with extrusion of the posterior body.  I have explained the findings to her.  The MRI was done at [facility].  She will need arthroscopy.  I have explained the procedure to her.  Return to see Dr. Christiane Ha for surgery.  Encounter Diagnosis  Name Primary?   Other tear of medial meniscus, current injury, left knee, subsequent encounter Yes   Electronically Signed Darreld Mclean, MD 6/16/202210:44 AM

## 2021-06-14 ENCOUNTER — Encounter: Payer: Self-pay | Admitting: Orthopedic Surgery

## 2021-06-14 ENCOUNTER — Ambulatory Visit: Payer: BC Managed Care – PPO

## 2021-06-14 ENCOUNTER — Other Ambulatory Visit: Payer: Self-pay

## 2021-06-14 ENCOUNTER — Ambulatory Visit: Payer: BC Managed Care – PPO | Admitting: Orthopedic Surgery

## 2021-06-14 VITALS — BP 168/104 | HR 70 | Ht 65.0 in | Wt 210.0 lb

## 2021-06-14 DIAGNOSIS — M25562 Pain in left knee: Secondary | ICD-10-CM

## 2021-06-14 DIAGNOSIS — S83242D Other tear of medial meniscus, current injury, left knee, subsequent encounter: Secondary | ICD-10-CM

## 2021-06-14 DIAGNOSIS — M1712 Unilateral primary osteoarthritis, left knee: Secondary | ICD-10-CM | POA: Diagnosis not present

## 2021-06-14 DIAGNOSIS — G8929 Other chronic pain: Secondary | ICD-10-CM

## 2021-06-14 NOTE — Patient Instructions (Addendum)
Post op appt first weds after surgery Your surgery will be at [facility] by Dr Romeo Apple  The hospital will contact you with a preoperative appointment to discuss Anesthesia. Please bring your medications with you for the appointment. They will tell you the arrival time and medication instructions when you have your preoperative evaluation. Do not wear nail polish the day of your surgery and if you take Phentermine you need to stop this medication ONE WEEK prior to your surgery.   Meniscus Injury, Arthroscopy   Arthroscopy is a surgical procedure that involves the use of a small scope that has a camera and surgical instruments on the end (arthroscope). An arthroscope can be used to repair your meniscus injury.  LET [facility] CARE PROVIDER KNOW ABOUT: Any allergies you have. All medicines you are taking, including vitamins, herbs, eyedrops, creams, and over-the-counter medicines. Any recent colds or infections you have had or currently have. Previous problems you or members of your family have had with the use of anesthetics. Any blood disorders or blood clotting problems you have. Previous surgeries you have had. Medical conditions you have. RISKS AND COMPLICATIONS Generally, this is a safe procedure. However, as with any procedure, problems can occur. Possible problems include: Damage to nerves or blood vessels. Excess bleeding. Blood clots. Infection. BEFORE THE PROCEDURE Do not eat or drink for 6-8 hours before the procedure. Take medicines as directed by your surgeon. Ask your surgeon about changing or stopping your regular medicines. You may have lab tests the morning of surgery. PROCEDURE  You will be given one of the following:  A medicine that numbs the area (local anesthesia). A medicine that makes you go to sleep (general anesthesia). A medicine injected into your spine that numbs your body below the waist (spinal anesthesia). Most often, several small cuts (incisions)  are made in the knee. The arthroscope and instruments go into the incisions to repair the damage. The torn portion of the meniscus is removed.   AFTER THE PROCEDURE You will be taken to the recovery area where your progress will be monitored. When you are awake, stable, and taking fluids without complications, you will be allowed to go home. This is usually the same day. A torn or stretched ligament (ligament sprain) may take 6-8 weeks to heal.  It takes about the 4-6 WEEKS if your surgeon removed a torn meniscus. A repaired meniscus may require 6-12 weeks of recovery time. A torn ligament needing reconstructive surgery may take 6-12 months to heal fully.   This information is not intended to replace advice given to you by your health care provider. Make sure you discuss any questions you have with your health care provider. You have decided to proceed with operative arthroscopy of the knee. You have decided not to continue with nonoperative measures such as but not limited to oral medication, weight loss, activity modification, physical therapy, bracing, or injection.  We will perform operative arthroscopy of the knee. Some of the risks associated with arthroscopic surgery of the knee include but are not limited to Bleeding Infection Swelling Stiffness Blood clot Pain Need for knee replacement surgery    In compliance with recent [place] law in federal regulation regarding opioid use and abuse and addiction, we will taper (stop) opioid medication after 2 weeks.  If you're not comfortable with these risks and would like to continue with nonoperative treatment please let Dr. Romeo Apple know prior to your surgery.

## 2021-06-14 NOTE — Progress Notes (Signed)
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 Alternative treatment has also been reviewed, questions were encouraged and answered. The postoperative plan is also been reviewed.   Chief Complaint  Patient presents with   Knee Pain    Left knee pain for several months/ surgical consult    55 year old female with left knee pain since March of this year.  She complains of medial knee pain with popping some swelling and giving way  She has been seen by primary care as well as Dr. Hilda Lias and her treatment to this point has been activity modification Celebrex cortisone injection  She cannot walk long distances she is hurting when she is working although there is been no direct trauma or images show that she has a torn medial meniscus at the posterior horn and root with chondral thinning of the medial femoral condyle and a small joint effusion  She presents for evaluation for possible surgery     MEDICAL DECISION MAKING  A.  Encounter Diagnosis  Name Primary?   Chronic pain of left knee Yes    B. DATA ANALYSED:   IMAGING: Interpretation of images: No images were available only the reports which indicate the findings as listed above  X-ray image was repeated  Orders: Arthroscopy left knee medial meniscectomy versus root repair  Outside records reviewed: Primary care records, Dr. Sanjuan Dame records   C. MANAGEMENT   Plan for surgery possible repair versus resection  No orders of the defined types were placed in this  encounter.    BP (!) 168/104   Pulse 70   Ht 5\' 5"  (1.651 m)   Wt 210 lb (95.3 kg)   BMI 34.95 kg/m    General appearance: Well-developed well-nourished no gross deformities  Cardiovascular normal pulse and perfusion normal color without edema  Neurologically no sensation loss or deficits or pathologic reflexes  Psychological: Awake alert and oriented x3 mood and affect normal  Skin no lacerations or ulcerations no nodularity no palpable masses, no erythema or nodularity  Musculoskeletal: Medial joint line pain along the left knee no effusion noted today decreased flexion only 105 degrees with pain.  Positive McMurray's sign with rotation of the tibia ACL and PCL collateral ligaments intact   ROS  No fever no chills no numbness or tingling   History reviewed. No pertinent past medical history.  Past Surgical History:  Procedure Laterality Date   CESAREAN SECTION     tummy tuck      Family History  Problem Relation Age of Onset   Kidney failure Mother    Social History   Tobacco Use   Smoking status: Never   Smokeless tobacco: Never  Substance Use Topics   Alcohol use: No   Drug use: No    Allergies  Allergen Reactions   Lisinopril Rash    "It gave me a rash".     No outpatient medications have been marked as taking for the 06/14/21 encounter (Office Visit) with 06/16/21, MD.        Vickki Hearing  Romeo Apple, MD  06/14/2021 8:56 AM

## 2021-06-15 ENCOUNTER — Telehealth: Payer: Self-pay | Admitting: Orthopedic Surgery

## 2021-06-15 NOTE — Telephone Encounter (Signed)
Spoke with patient regarding forms received via fax from short-term disability insurer, [employer]. Aware of forms process through our provider [facility]. States will come to office tomorrow morning, 06/16/21 to complete.

## 2021-06-27 NOTE — Patient Instructions (Signed)
Jennifer Ellis  06/27/2021     @PREFPERIOPPHARMACY @   Your procedure is scheduled on  07/04/2021.   [facility] at   0700  A.M.   Call this number if you have problems the morning of surgery:  [phone]   Remember:  Do not eat or drink after midnight.      Take these medicines the morning of surgery with A SIP OF WATER     NONE      Do not wear jewelry, make-up or nail polish.  Do not wear lotions, powders, or perfumes, or deodorant.  Do not shave 48 hours prior to surgery.  Men may shave face and neck.  Do not bring valuables to the hospital.  [facility] is not responsible for any belongings or valuables.  Contacts, dentures or bridgework may not be worn into surgery.  Leave your suitcase in the car.  After surgery it may be brought to your room.  For patients admitted to the hospital, discharge time will be determined by your treatment team.  Patients discharged the day of surgery will not be allowed to drive home and must have someone with them for 24 hours.    Special instructions:  DO NOT smoke tobacco or vape for 24 hours before your procedure.  Please read over the following fact sheets that you were given. Coughing and Deep Breathing, Surgical Site Infection Prevention, Anesthesia Post-op Instructions, and Care and Recovery After Surgery      Arthroscopic Knee Ligament Repair, Care After This sheet gives you information about how to care for yourself after your procedure. Your health care provider may also give you more specific instructions. If you have problems or questions, contact your health careprovider. What can I expect after the procedure? After the procedure, it is common to have: Soreness or pain in your knee. Bruising and swelling on your knee, calf, and ankle for 3-4 days. A small amount of fluid coming from the incisions. Follow these instructions at home: Medicines Take over-the-counter and prescription medicines only  as told by your health care provider. Ask your health care provider if the medicine prescribed to you: Requires you to avoid driving or using machinery. Can cause constipation. You may need to take these actions to prevent or treat constipation: Drink enough fluid to keep your urine pale yellow. Take over-the-counter or prescription medicines. Eat foods that are high in fiber, such as beans, whole grains, and fresh fruits and vegetables. Limit foods that are high in fat and processed sugars, such as fried or sweet foods. If you have a brace or immobilizer: Wear it as told by your health care provider. Remove it only as told by your health care provider. Loosen it if your toes tingle, become numb, or turn cold and blue. Keep it clean and dry. Ask your health care provider when it is safe to drive. Bathing Do not take baths, swim, or use a hot tub until your health care provider approves. Keep your bandage (dressing) dry until your health care provider says that it can be removed. If the brace or immobilizer is not waterproof: Do not let it get wet. Cover it with a watertight covering when you take a bath or shower. Incision care  Follow instructions from your health care provider about how to take care of your incisions. Make sure you: Wash your hands with soap and water for at least 20 seconds before and after you  change your dressing. If soap and water are not available, use hand sanitizer. Change your dressing as told by your health care provider. Leave stitches (sutures), skin glue, or adhesive strips in place. These skin closures may need to stay in place for 2 weeks or longer. If adhesive strip edges start to loosen and curl up, you may trim the loose edges. Do not remove adhesive strips completely unless your health care provider tells you to do that. Check your incision areas every day for signs of infection. Check for: Redness. More swelling or pain. Blood or more  fluid. Warmth. Pus or a bad smell.  Managing pain, stiffness, and swelling  If directed, put ice on the affected area. To do this: If you have a removable brace or immobilizer, remove it as told by your health care provider. Put ice in a plastic bag. Place a towel between your skin and the bag. Leave the ice on for 20 minutes, 2-3 times a day. Remove the ice if your skin turns bright red. This is very important. If you cannot feel pain, heat, or cold, you have a greater risk of damage to the area. Move your toes often to reduce stiffness and swelling. Raise (elevate) the injured area above the level of your heart while you are sitting or lying down.  Activity Do not use your knee to support your body weight until your health care provider says that you can. Use crutches or other devices as told by your health care provider. Do physical therapy exercises as told by your health care provider. Physical therapy will help you regain movement and strength in your knee. Follow instructions from your health care provider about: When you may start motion exercises. When you may start riding a stationary bike and doing other low-impact activities. When you may start to jog and do other high-impact activities. Do not lift anything that is heavier than 10 lb (4.5 kg), or the limit that you are told, until your health care provider says that it is safe. Ask your health care provider what activities are safe for you. General instructions Do not use any products that contain nicotine or tobacco, such as cigarettes, e-cigarettes, and chewing tobacco. These can delay healing. If you need help quitting, ask your health care provider. Wear compression stockings as told by your health care provider. These stockings help to prevent blood clots and reduce swelling in your legs. Keep all follow-up visits. This is important. Contact a health care provider if: You have any of these signs of infection: Redness  around an incision. Blood or more fluid coming from an incision. Warmth coming from an incision. Pus or a bad smell coming from an incision. More swelling or pain in your knee. A fever or chills. You have pain that does not get better with medicine. Your incision opens up. Get help right away if: You have trouble breathing. You have chest pain. You have increased pain or swelling in your calf or at the back of your knee. You have numbness and tingling near the knee joint or in the foot, ankle, or toes. You notice that your foot or toes look darker than normal or are cooler than normal. These symptoms may represent a serious problem that is an emergency. Do not wait to see if the symptoms will go away. Get medical help right away. Call your local emergency services (911 in the [place] Do not drive yourself to the hospital. Summary After the procedure, it is common  to have knee pain with bruising and swelling on your knee, calf, and ankle. Icing your knee and raising your leg above the level of your heart will help control the pain and swelling. Do physical therapy exercises as told by your health care provider. Physical therapy will help you regain movement and strength in your knee. This information is not intended to replace advice given to you by your health care provider. Make sure you discuss any questions you have with your healthcare provider. Document Revised: 05/09/2020 Document Reviewed: 05/09/2020 Elsevier Patient Education  2022 [employer] General Anesthesia, Adult, Care After This sheet gives you information about how to care for yourself after your procedure. Your health care provider may also give you more specific instructions. If you have problems or questions, contact your health careprovider. What can I expect after the procedure? After the procedure, the following side effects are common: Pain or discomfort at the IV site. Nausea. Vomiting. Sore throat. Trouble  concentrating. Feeling cold or chills. Feeling weak or tired. Sleepiness and fatigue. Soreness and body aches. These side effects can affect parts of the body that were not involved in surgery. Follow these instructions at home: For the time period you were told by your health care provider:  Rest. Do not participate in activities where you could fall or become injured. Do not drive or use machinery. Do not drink alcohol. Do not take sleeping pills or medicines that cause drowsiness. Do not make important decisions or sign legal documents. Do not take care of children on your own.  Eating and drinking Follow any instructions from your health care provider about eating or drinking restrictions. When you feel hungry, start by eating small amounts of foods that are soft and easy to digest (bland), such as toast. Gradually return to your regular diet. Drink enough fluid to keep your urine pale yellow. If you vomit, rehydrate by drinking water, juice, or clear broth. General instructions If you have sleep apnea, surgery and certain medicines can increase your risk for breathing problems. Follow instructions from your health care provider about wearing your sleep device: Anytime you are sleeping, including during daytime naps. While taking prescription pain medicines, sleeping medicines, or medicines that make you drowsy. Have a responsible adult stay with you for the time you are told. It is important to have someone help care for you until you are awake and alert. Return to your normal activities as told by your health care provider. Ask your health care provider what activities are safe for you. Take over-the-counter and prescription medicines only as told by your health care provider. If you smoke, do not smoke without supervision. Keep all follow-up visits as told by your health care provider. This is important. Contact a health care provider if: You have nausea or vomiting that does  not get better with medicine. You cannot eat or drink without vomiting. You have pain that does not get better with medicine. You are unable to pass urine. You develop a skin rash. You have a fever. You have redness around your IV site that gets worse. Get help right away if: You have difficulty breathing. You have chest pain. You have blood in your urine or stool, or you vomit blood. Summary After the procedure, it is common to have a sore throat or nausea. It is also common to feel tired. Have a responsible adult stay with you for the time you are told. It is important to have someone help care for you  until you are awake and alert. When you feel hungry, start by eating small amounts of foods that are soft and easy to digest (bland), such as toast. Gradually return to your regular diet. Drink enough fluid to keep your urine pale yellow. Return to your normal activities as told by your health care provider. Ask your health care provider what activities are safe for you. This information is not intended to replace advice given to you by your health care provider. Make sure you discuss any questions you have with your healthcare provider. Document Revised: 08/25/2020 Document Reviewed: 03/24/2020 Elsevier Patient Education  2022 [employer] Inc. How to Use Chlorhexidine for Bathing Chlorhexidine gluconate (CHG) is a germ-killing (antiseptic) solution that is used to clean the skin. It can get rid of the bacteria that normally live on the skin and can keep them away for about 24 hours. To clean your skin with CHG, you may be given: A CHG solution to use in the shower or as part of a sponge bath. A prepackaged cloth that contains CHG. Cleaning your skin with CHG may help lower the risk for infection: While you are staying in the intensive care unit of the hospital. If you have a vascular access, such as a central line, to provide short-term or long-term access to your veins. If you have a  catheter to drain urine from your bladder. If you are on a ventilator. A ventilator is a machine that helps you breathe by moving air in and out of your lungs. After surgery. What are the risks? Risks of using CHG include: A skin reaction. Hearing loss, if CHG gets in your ears. Eye injury, if CHG gets in your eyes and is not rinsed out. The CHG product catching fire. Make sure that you avoid smoking and flames after applying CHG to your skin. Do not use CHG: If you have a chlorhexidine allergy or have previously reacted to chlorhexidine. On babies younger than 41 months of age. How to use CHG solution Use CHG only as told by your health care provider, and follow the instructions on the label. Use the full amount of CHG as directed. Usually, this is one bottle. During a shower Follow these steps when using CHG solution during a shower (unless your health care provider gives you different instructions): Start the shower. Use your normal soap and shampoo to wash your face and hair. Turn off the shower or move out of the shower stream. Pour the CHG onto a clean washcloth. Do not use any type of brush or rough-edged sponge. Starting at your neck, lather your body down to your toes. Make sure you follow these instructions: If you will be having surgery, pay special attention to the part of your body where you will be having surgery. Scrub this area for at least 1 minute. Do not use CHG on your head or face. If the solution gets into your ears or eyes, rinse them well with water. Avoid your genital area. Avoid any areas of skin that have broken skin, cuts, or scrapes. Scrub your back and under your arms. Make sure to wash skin folds. Let the lather sit on your skin for 1-2 minutes or as long as told by your health care provider. Thoroughly rinse your entire body in the shower. Make sure that all body creases and crevices are rinsed well. Dry off with a clean towel. Do not put any substances on  your body afterward--such as powder, lotion, or perfume--unless you are told  to do so by your health care provider. Only use lotions that are recommended by the manufacturer. Put on clean clothes or pajamas. If it is the night before your surgery, sleep in clean sheets.  During a sponge bath Follow these steps when using CHG solution during a sponge bath (unless your health care provider gives you different instructions): Use your normal soap and shampoo to wash your face and hair. Pour the CHG onto a clean washcloth. Starting at your neck, lather your body down to your toes. Make sure you follow these instructions: If you will be having surgery, pay special attention to the part of your body where you will be having surgery. Scrub this area for at least 1 minute. Do not use CHG on your head or face. If the solution gets into your ears or eyes, rinse them well with water. Avoid your genital area. Avoid any areas of skin that have broken skin, cuts, or scrapes. Scrub your back and under your arms. Make sure to wash skin folds. Let the lather sit on your skin for 1-2 minutes or as long as told by your health care provider. Using a different clean, wet washcloth, thoroughly rinse your entire body. Make sure that all body creases and crevices are rinsed well. Dry off with a clean towel. Do not put any substances on your body afterward--such as powder, lotion, or perfume--unless you are told to do so by your health care provider. Only use lotions that are recommended by the manufacturer. Put on clean clothes or pajamas. If it is the night before your surgery, sleep in clean sheets. How to use CHG prepackaged cloths Only use CHG cloths as told by your health care provider, and follow the instructions on the label. Use the CHG cloth on clean, dry skin. Do not use the CHG cloth on your head or face unless your health care provider tells you to. When washing with the CHG cloth: Avoid your genital  area. Avoid any areas of skin that have broken skin, cuts, or scrapes. Before surgery Follow these steps when using a CHG cloth to clean before surgery (unless your health care provider gives you different instructions): Using the CHG cloth, vigorously scrub the part of your body where you will be having surgery. Scrub using a back-and-forth motion for 3 minutes. The area on your body should be completely wet with CHG when you are done scrubbing. Do not rinse. Discard the cloth and let the area air-dry. Do not put any substances on the area afterward, such as powder, lotion, or perfume. Put on clean clothes or pajamas. If it is the night before your surgery, sleep in clean sheets.  For general bathing Follow these steps when using CHG cloths for general bathing (unless your health care provider gives you different instructions). Use a separate CHG cloth for each area of your body. Make sure you wash between any folds of skin and between your fingers and toes. Wash your body in the following order, switching to a new cloth after each step: The front of your neck, shoulders, and chest. Both of your arms, under your arms, and your hands. Your stomach and groin area, avoiding the genitals. Your right leg and foot. Your left leg and foot. The back of your neck, your back, and your buttocks. Do not rinse. Discard the cloth and let the area air-dry. Do not put any substances on your body afterward--such as powder, lotion, or perfume--unless you are told to do so  by your health care provider. Only use lotions that are recommended by the manufacturer. Put on clean clothes or pajamas. Contact a health care provider if: Your skin gets irritated after scrubbing. You have questions about using your solution or cloth. Get help right away if: Your eyes become very red or swollen. Your eyes itch badly. Your skin itches badly and is red or swollen. Your hearing changes. You have trouble seeing. You have  swelling or tingling in your mouth or throat. You have trouble breathing. You swallow any chlorhexidine. Summary Chlorhexidine gluconate (CHG) is a germ-killing (antiseptic) solution that is used to clean the skin. Cleaning your skin with CHG may help to lower your risk for infection. You may be given CHG to use for bathing. It may be in a bottle or in a prepackaged cloth to use on your skin. Carefully follow your health care provider's instructions and the instructions on the product label. Do not use CHG if you have a chlorhexidine allergy. Contact your health care provider if your skin gets irritated after scrubbing. This information is not intended to replace advice given to you by your health care provider. Make sure you discuss any questions you have with your healthcare provider. Document Revised: 04/22/2020 Document Reviewed: 05/27/2020 Elsevier Patient Education  2022 [employer].

## 2021-06-28 ENCOUNTER — Other Ambulatory Visit: Payer: Self-pay | Admitting: Orthopedic Surgery

## 2021-06-28 DIAGNOSIS — S83242D Other tear of medial meniscus, current injury, left knee, subsequent encounter: Secondary | ICD-10-CM

## 2021-06-28 NOTE — Progress Notes (Signed)
Order ID: [id number] In Progress   Anticipated Determination Date:06/30/2021

## 2021-06-29 ENCOUNTER — Encounter (HOSPITAL_COMMUNITY)
Admission: RE | Admit: 2021-06-29 | Discharge: 2021-06-29 | Disposition: A | Discharge status: Home / Self Care | Payer: BC Managed Care – PPO | Source: Ambulatory Visit | Attending: Orthopedic Surgery | Admitting: Orthopedic Surgery

## 2021-06-29 ENCOUNTER — Other Ambulatory Visit: Payer: Self-pay

## 2021-06-29 ENCOUNTER — Encounter (HOSPITAL_COMMUNITY): Payer: Self-pay

## 2021-06-29 DIAGNOSIS — Z01812 Encounter for preprocedural laboratory examination: Secondary | ICD-10-CM | POA: Insufficient documentation

## 2021-06-29 HISTORY — DX: Unspecified osteoarthritis, unspecified site: M19.90

## 2021-06-29 LAB — CBC WITH DIFFERENTIAL/PLATELET
Abs Immature Granulocytes: 0.03 10*3/uL (ref 0.00–0.07)
Basophils Absolute: 0.1 10*3/uL (ref 0.0–0.1)
Basophils Relative: 1 %
Eosinophils Absolute: 0.2 10*3/uL (ref 0.0–0.5)
Eosinophils Relative: 3 %
HCT: 41.7 % (ref 36.0–46.0)
Hemoglobin: 13.8 g/dL (ref 12.0–15.0)
Immature Granulocytes: 0 %
Lymphocytes Relative: 26 %
Lymphs Abs: 1.8 10*3/uL (ref 0.7–4.0)
MCH: 31.9 pg (ref 26.0–34.0)
MCHC: 33.1 g/dL (ref 30.0–36.0)
MCV: 96.3 fL (ref 80.0–100.0)
Monocytes Absolute: 0.5 10*3/uL (ref 0.1–1.0)
Monocytes Relative: 7 %
Neutro Abs: 4.3 10*3/uL (ref 1.7–7.7)
Neutrophils Relative %: 63 %
Platelets: 262 10*3/uL (ref 150–400)
RBC: 4.33 MIL/uL (ref 3.87–5.11)
RDW: 13.2 % (ref 11.5–15.5)
WBC: 6.9 10*3/uL (ref 4.0–10.5)
nRBC: 0 % (ref 0.0–0.2)

## 2021-06-29 LAB — BASIC METABOLIC PANEL
Anion gap: 6 (ref 5–15)
BUN: 7 mg/dL (ref 6–20)
CO2: 29 mmol/L (ref 22–32)
Calcium: 9.3 mg/dL (ref 8.9–10.3)
Chloride: 103 mmol/L (ref 98–111)
Creatinine, Ser: 0.75 mg/dL (ref 0.44–1.00)
GFR, Estimated: >60 mL/min (ref >60)
Glucose, Bld: 85 mg/dL (ref 70–99)
Potassium: 3.8 mmol/L (ref 3.5–5.1)
Sodium: 138 mmol/L (ref 135–145)

## 2021-06-29 LAB — HCG, SERUM, QUALITATIVE: Preg, Serum: NEGATIVE

## 2021-07-03 NOTE — H&P (Signed)
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 Alternative treatment has also been reviewed, questions were encouraged and answered. The postoperative plan is also been reviewed.         Chief Complaint  Patient presents with   Knee Pain      Left knee pain for several months/ surgical consult     55 year old female with left knee pain since March of this year.  She complains of medial knee pain with popping some swelling and giving way   She has been seen by primary care as well as Dr. Hilda Lias and her treatment to this point has been activity modification Celebrex cortisone injection   She cannot walk long distances she is hurting when she is working although there is been no direct trauma or images show that she has a torn medial meniscus at the posterior horn and root with chondral thinning of the medial femoral condyle and a small joint effusion   She presents for evaluation for possible surgery        MEDICAL DECISION MAKING   A.      Encounter Diagnosis  Name Primary?   Chronic pain of left knee Yes      B. DATA ANALYSED:   IMAGING: Interpretation of images: No images were available only the reports which indicate the findings as listed above  X-ray image was repeated  Orders: Arthroscopy left knee medial meniscectomy versus root repair  Outside records reviewed: Primary care records, Dr. Sanjuan Dame records    C. MANAGEMENT    Plan for surgery possible repair versus resection   No orders of the defined types were placed in this  encounter.       BP (!) 168/104   Pulse 70   Ht 5\' 5"  (1.651 m)   Wt 210 lb (95.3 kg)   BMI 34.95 kg/m      General appearance: Well-developed well-nourished no gross deformities  Cardiovascular normal pulse and perfusion normal color without edema  Neurologically no sensation loss or deficits or pathologic reflexes   Psychological: Awake alert and oriented x3 mood and affect normal   Skin no lacerations or ulcerations no nodularity no palpable masses, no erythema or nodularity   Musculoskeletal: Medial joint line pain along the left knee no effusion noted today decreased flexion only 105 degrees with pain.  Positive McMurray's sign with rotation of the tibia ACL and PCL collateral ligaments intact     ROS   No fever no chills no numbness or tingling     History reviewed. No pertinent past medical history.        Past Surgical History:  Procedure Laterality Date   CESAREAN SECTION       tummy tuck               Family History  Problem Relation Age of Onset   Kidney failure Mother      Social History        Tobacco Use   Smoking status: Never   Smokeless tobacco: Never  Substance Use Topics  Alcohol use: No   Drug use: No           Allergies  Allergen Reactions   Lisinopril Rash      "It gave me a rash".       Active Medications  No outpatient medications have been marked as taking for the 06/14/21 encounter (Office Visit) with Vickki Hearing, MD.                Fuller Canada, MD   06/14/2021 8:56 AM

## 2021-07-04 ENCOUNTER — Encounter: Payer: Self-pay | Admitting: Orthopedic Surgery

## 2021-07-04 ENCOUNTER — Encounter (HOSPITAL_COMMUNITY)
Admission: RE | Disposition: A | Discharge status: Home / Self Care | Payer: Self-pay | Source: Home / Self Care | Attending: Orthopedic Surgery

## 2021-07-04 ENCOUNTER — Other Ambulatory Visit: Payer: Self-pay

## 2021-07-04 ENCOUNTER — Ambulatory Visit (HOSPITAL_COMMUNITY): Payer: BC Managed Care – PPO | Admitting: Certified Registered"

## 2021-07-04 ENCOUNTER — Encounter (HOSPITAL_COMMUNITY): Payer: Self-pay | Admitting: Orthopedic Surgery

## 2021-07-04 ENCOUNTER — Ambulatory Visit (HOSPITAL_COMMUNITY)
Admission: RE | Admit: 2021-07-04 | Discharge: 2021-07-04 | Disposition: A | Discharge status: Home / Self Care | Payer: BC Managed Care – PPO | Attending: Orthopedic Surgery | Admitting: Orthopedic Surgery

## 2021-07-04 DIAGNOSIS — Z888 Allergy status to other drugs, medicaments and biological substances status: Secondary | ICD-10-CM | POA: Insufficient documentation

## 2021-07-04 DIAGNOSIS — M23222 Derangement of posterior horn of medial meniscus due to old tear or injury, left knee: Secondary | ICD-10-CM | POA: Insufficient documentation

## 2021-07-04 DIAGNOSIS — M1712 Unilateral primary osteoarthritis, left knee: Secondary | ICD-10-CM | POA: Insufficient documentation

## 2021-07-04 DIAGNOSIS — S83242A Other tear of medial meniscus, current injury, left knee, initial encounter: Secondary | ICD-10-CM

## 2021-07-04 DIAGNOSIS — S83242D Other tear of medial meniscus, current injury, left knee, subsequent encounter: Secondary | ICD-10-CM

## 2021-07-04 HISTORY — PX: KNEE ARTHROSCOPY WITH MEDIAL MENISECTOMY: SHX5651

## 2021-07-04 SURGERY — ARTHROSCOPY, KNEE, WITH MEDIAL MENISCECTOMY
Anesthesia: General | Site: Knee | Laterality: Left

## 2021-07-04 MED ORDER — GLYCOPYRROLATE 0.2 MG/ML IJ SOLN
INTRAMUSCULAR | Status: DC | PRN
  Administered 2021-07-04: .1 mg via INTRAVENOUS

## 2021-07-04 MED ORDER — PROPOFOL 10 MG/ML IV BOLUS
INTRAVENOUS | Status: DC | PRN
  Administered 2021-07-04 (×2): 50 mg via INTRAVENOUS
  Administered 2021-07-04: 200 mg via INTRAVENOUS

## 2021-07-04 MED ORDER — GLYCOPYRROLATE PF 0.2 MG/ML IJ SOSY
PREFILLED_SYRINGE | INTRAMUSCULAR | Status: AC
  Filled 2021-07-04: qty 1

## 2021-07-04 MED ORDER — EPINEPHRINE PF 1 MG/ML IJ SOLN
INTRAMUSCULAR | Status: AC
  Filled 2021-07-04: qty 4

## 2021-07-04 MED ORDER — ORAL CARE MOUTH RINSE: 15.0000 mL | Freq: Once | OROMUCOSAL | Status: AC

## 2021-07-04 MED ORDER — DEXMEDETOMIDINE HCL 200 MCG/2ML IV SOLN
INTRAVENOUS | Status: DC | PRN
  Administered 2021-07-04: 20 ug via INTRAVENOUS

## 2021-07-04 MED ORDER — BUPIVACAINE-EPINEPHRINE (PF) 0.5% -1:200000 IJ SOLN
INTRAMUSCULAR | Status: DC | PRN
  Administered 2021-07-04: 60 mL via PERINEURAL

## 2021-07-04 MED ORDER — DEXAMETHASONE SODIUM PHOSPHATE 10 MG/ML IJ SOLN
INTRAMUSCULAR | Status: AC
  Filled 2021-07-04: qty 1

## 2021-07-04 MED ORDER — IBUPROFEN 800 MG PO TABS: 800.0000 mg | ORAL_TABLET | Freq: Three times a day (TID) | ORAL | 1 refills | Status: DC | PRN

## 2021-07-04 MED ORDER — MIDAZOLAM HCL 2 MG/2ML IJ SOLN
INTRAMUSCULAR | Status: AC
  Filled 2021-07-04: qty 2

## 2021-07-04 MED ORDER — ONDANSETRON HCL 4 MG/2ML IJ SOLN
4.0000 mg | Freq: Once | INTRAMUSCULAR | Status: AC
  Administered 2021-07-04: 4 mg via INTRAVENOUS
  Filled 2021-07-04: qty 2

## 2021-07-04 MED ORDER — HYDROMORPHONE HCL 1 MG/ML IJ SOLN
1.0000 mg | Freq: Once | INTRAMUSCULAR | Status: DC
  Filled 2021-07-04: qty 1

## 2021-07-04 MED ORDER — CEFAZOLIN SODIUM-DEXTROSE 2-4 GM/100ML-% IV SOLN
2.0000 g | INTRAVENOUS | Status: AC
  Administered 2021-07-04: 2 g via INTRAVENOUS

## 2021-07-04 MED ORDER — ACETAMINOPHEN 500 MG PO TABS
500.0000 mg | ORAL_TABLET | Freq: Once | ORAL | Status: AC
  Administered 2021-07-04: 500 mg via ORAL
  Filled 2021-07-04: qty 1

## 2021-07-04 MED ORDER — MIDAZOLAM HCL 5 MG/5ML IJ SOLN
INTRAMUSCULAR | Status: DC | PRN
  Administered 2021-07-04: 2 mg via INTRAVENOUS

## 2021-07-04 MED ORDER — HYDROCODONE-ACETAMINOPHEN 5-325 MG PO TABS
1.0000 | ORAL_TABLET | Freq: Once | ORAL | Status: AC
  Administered 2021-07-04: 1 via ORAL

## 2021-07-04 MED ORDER — FENTANYL CITRATE (PF) 100 MCG/2ML IJ SOLN
INTRAMUSCULAR | Status: AC
  Filled 2021-07-04: qty 2

## 2021-07-04 MED ORDER — BUPIVACAINE-EPINEPHRINE (PF) 0.5% -1:200000 IJ SOLN
INTRAMUSCULAR | Status: AC
  Filled 2021-07-04: qty 60

## 2021-07-04 MED ORDER — IBUPROFEN 800 MG PO TABS
800.0000 mg | ORAL_TABLET | Freq: Once | ORAL | Status: AC
  Administered 2021-07-04: 800 mg via ORAL
  Filled 2021-07-04: qty 1

## 2021-07-04 MED ORDER — SODIUM CHLORIDE 0.9 % IR SOLN
Status: DC | PRN
  Administered 2021-07-04 (×4): 3000 mL

## 2021-07-04 MED ORDER — ONDANSETRON HCL 4 MG/2ML IJ SOLN
INTRAMUSCULAR | Status: AC
  Filled 2021-07-04: qty 2

## 2021-07-04 MED ORDER — CEFAZOLIN SODIUM-DEXTROSE 2-4 GM/100ML-% IV SOLN
INTRAVENOUS | Status: AC
  Filled 2021-07-04: qty 100

## 2021-07-04 MED ORDER — DEXAMETHASONE SODIUM PHOSPHATE 4 MG/ML IJ SOLN
INTRAMUSCULAR | Status: DC | PRN
  Administered 2021-07-04: 4 mg via INTRAVENOUS

## 2021-07-04 MED ORDER — FENTANYL CITRATE (PF) 100 MCG/2ML IJ SOLN
INTRAMUSCULAR | Status: DC | PRN
  Administered 2021-07-04 (×6): 25 ug via INTRAVENOUS
  Administered 2021-07-04: 50 ug via INTRAVENOUS

## 2021-07-04 MED ORDER — FENTANYL CITRATE (PF) 100 MCG/2ML IJ SOLN
25.0000 ug | INTRAMUSCULAR | Status: DC | PRN
  Administered 2021-07-04: 50 ug via INTRAVENOUS
  Filled 2021-07-04: qty 2

## 2021-07-04 MED ORDER — HYDROCODONE-ACETAMINOPHEN 5-325 MG PO TABS: 1.0000 | ORAL_TABLET | ORAL | 0 refills | Status: AC | PRN

## 2021-07-04 MED ORDER — LACTATED RINGERS IV SOLN: INTRAVENOUS | Status: DC

## 2021-07-04 MED ORDER — LIDOCAINE HCL (PF) 2 % IJ SOLN
INTRAMUSCULAR | Status: AC
  Filled 2021-07-04: qty 5

## 2021-07-04 MED ORDER — PROMETHAZINE HCL 12.5 MG PO TABS: 12.5000 mg | ORAL_TABLET | Freq: Four times a day (QID) | ORAL | 0 refills | Status: DC | PRN

## 2021-07-04 MED ORDER — SODIUM CHLORIDE 0.9 % IR SOLN
Status: DC | PRN
  Administered 2021-07-04: 1000 mL

## 2021-07-04 MED ORDER — LIDOCAINE HCL (CARDIAC) PF 100 MG/5ML IV SOSY
PREFILLED_SYRINGE | INTRAVENOUS | Status: DC | PRN
  Administered 2021-07-04: 100 mg via INTRAVENOUS

## 2021-07-04 MED ORDER — PROPOFOL 10 MG/ML IV BOLUS
INTRAVENOUS | Status: AC
  Filled 2021-07-04: qty 20

## 2021-07-04 MED ORDER — CHLORHEXIDINE GLUCONATE 0.12 % MT SOLN
OROMUCOSAL | Status: AC
  Filled 2021-07-04: qty 15

## 2021-07-04 MED ORDER — ONDANSETRON HCL 4 MG/2ML IJ SOLN
4.0000 mg | Freq: Once | INTRAMUSCULAR | Status: AC | PRN
  Administered 2021-07-04: 4 mg via INTRAVENOUS
  Filled 2021-07-04: qty 2

## 2021-07-04 MED ORDER — CHLORHEXIDINE GLUCONATE 0.12 % MT SOLN
15.0000 mL | Freq: Once | OROMUCOSAL | Status: AC
  Administered 2021-07-04: 15 mL via OROMUCOSAL

## 2021-07-04 MED ORDER — HYDROCODONE-ACETAMINOPHEN 5-325 MG PO TABS
ORAL_TABLET | ORAL | Status: AC
  Filled 2021-07-04: qty 1

## 2021-07-04 MED ORDER — ONDANSETRON HCL 4 MG/2ML IJ SOLN
INTRAMUSCULAR | Status: DC | PRN
  Administered 2021-07-04: 4 mg via INTRAVENOUS

## 2021-07-04 SURGICAL SUPPLY — 52 items
ABLATOR ASPIRATE 50D MULTI-PRT (SURGICAL WAND) ×1 IMPLANT
APL PRP STRL LF DISP 70% ISPRP (MISCELLANEOUS) ×1
BAG HAMPER (MISCELLANEOUS) ×2 IMPLANT
BANDAGE ELASTIC 6 VELCRO NS (GAUZE/BANDAGES/DRESSINGS) ×1 IMPLANT
BLADE SHAVER TORPEDO 4X13 (MISCELLANEOUS) IMPLANT
BLADE SURG SZ11 CARB STEEL (BLADE) ×2 IMPLANT
BNDG CMPR STD VLCR NS LF 5.8X6 (GAUZE/BANDAGES/DRESSINGS) ×1
BNDG ELASTIC 6X5.8 VLCR NS LF (GAUZE/BANDAGES/DRESSINGS) ×2 IMPLANT
CHLORAPREP W/TINT 26 (MISCELLANEOUS) ×2 IMPLANT
CLOTH BEACON ORANGE TIMEOUT ST (SAFETY) ×2 IMPLANT
COOLER ICEMAN CLASSIC (MISCELLANEOUS) ×2 IMPLANT
CUFF TOURN SGL QUICK 34 (TOURNIQUET CUFF) ×2
CUFF TRNQT CYL 34X4.125X (TOURNIQUET CUFF) IMPLANT
DECANTER SPIKE VIAL GLASS SM (MISCELLANEOUS) ×3 IMPLANT
DRAPE HALF SHEET 40X57 (DRAPES) ×2 IMPLANT
GAUZE 4X4 16PLY ~~LOC~~+RFID DBL (SPONGE) ×2 IMPLANT
GAUZE SPONGE 4X4 12PLY STRL (GAUZE/BANDAGES/DRESSINGS) ×2 IMPLANT
GAUZE SPONGE 4X4 16PLY XRAY LF (GAUZE/BANDAGES/DRESSINGS) ×2 IMPLANT
GAUZE XEROFORM 5X9 LF (GAUZE/BANDAGES/DRESSINGS) ×2 IMPLANT
GLOVE SKINSENSE NS SZ8.0 LF (GLOVE) ×1
GLOVE SKINSENSE STRL SZ8.0 LF (GLOVE) ×1 IMPLANT
GLOVE SS N UNI LF 8.5 STRL (GLOVE) ×2 IMPLANT
GLOVE SURG UNDER POLY LF SZ7 (GLOVE) ×4 IMPLANT
GOWN STRL REUS W/TWL LRG LVL3 (GOWN DISPOSABLE) ×2 IMPLANT
GOWN STRL REUS W/TWL XL LVL3 (GOWN DISPOSABLE) ×2 IMPLANT
IMPL MENISCAL CINCH II (Anchor) ×2 IMPLANT
IMPLANT MENISCAL CINCH II (Anchor) ×4 IMPLANT
IV NS IRRIG 3000ML ARTHROMATIC (IV SOLUTION) ×6 IMPLANT
KIT BLADEGUARD II DBL (SET/KITS/TRAYS/PACK) ×2 IMPLANT
KIT TURNOVER CYSTO (KITS) ×2 IMPLANT
MANIFOLD NEPTUNE II (INSTRUMENTS) ×2 IMPLANT
MARKER SKIN DUAL TIP RULER LAB (MISCELLANEOUS) ×2 IMPLANT
NDL HYPO 18GX1.5 BLUNT FILL (NEEDLE) ×1 IMPLANT
NDL SPNL 18GX3.5 QUINCKE PK (NEEDLE) ×1 IMPLANT
NEEDLE HYPO 18GX1.5 BLUNT FILL (NEEDLE) ×2 IMPLANT
NEEDLE HYPO 21X1.5 SAFETY (NEEDLE) ×2 IMPLANT
NEEDLE SPNL 18GX3.5 QUINCKE PK (NEEDLE) ×2 IMPLANT
NS IRRIG 1000ML POUR BTL (IV SOLUTION) ×2 IMPLANT
PACK ARTHRO LIMB DRAPE STRL (MISCELLANEOUS) ×2 IMPLANT
PAD ABD 5X9 TENDERSORB (GAUZE/BANDAGES/DRESSINGS) ×2 IMPLANT
PAD ARMBOARD 7.5X6 YLW CONV (MISCELLANEOUS) ×2 IMPLANT
PAD COLD SHLDR WRAP-ON (PAD) ×2 IMPLANT
PAD FOR LEG HOLDER (MISCELLANEOUS) ×2 IMPLANT
PADDING CAST COTTON 6X4 STRL (CAST SUPPLIES) ×2 IMPLANT
RESECTOR TORPEDO 4MM 13CM CVD (MISCELLANEOUS) ×2 IMPLANT
SET ARTHROSCOPY INST (INSTRUMENTS) ×2 IMPLANT
SET BASIN LINEN APH (SET/KITS/TRAYS/PACK) ×2 IMPLANT
SUT ETHILON 3 0 FSL (SUTURE) ×2 IMPLANT
SYR 10ML LL (SYRINGE) ×2 IMPLANT
SYR 30ML LL (SYRINGE) ×2 IMPLANT
TUBE CONNECTING 12X1/4 (SUCTIONS) ×5 IMPLANT
TUBING IN/OUT FLOW W/MAIN PUMP (TUBING) ×2 IMPLANT

## 2021-07-04 NOTE — Transfer of Care (Signed)
Immediate Anesthesia Transfer of Care Note  Patient: Jennifer Ellis  Procedure(s) Performed: KNEE ARTHROSCOPY WITH PARTIAL MEDIAL MENISCECTOMY AND MEDIAL MENISCUS REPAIR (Left: Knee)  Patient Location: PACU  Anesthesia Type:General  Level of Consciousness: awake, oriented, drowsy and patient cooperative  Airway & Oxygen Therapy: Patient Spontanous Breathing and Patient connected to nasal cannula oxygen  Post-op Assessment: Report given to RN, Post -op Vital signs reviewed and stable and Patient moving all extremities X 4  Post vital signs: Reviewed and stable  Last Vitals:  Vitals Value Taken Time  BP 119/87 07/04/21 1024  Temp    Pulse 80 07/04/21 1025  Resp 14 07/04/21 1025  SpO2 95 % 07/04/21 1025  Vitals shown include unvalidated device data.  Last Pain:  Vitals:   07/04/21 0758  TempSrc: Oral  PainSc: 5       Patients Stated Pain Goal: 10 (07/04/21 0758)  Complications: No notable events documented.

## 2021-07-04 NOTE — Brief Op Note (Signed)
07/04/2021  10:25 AM  PATIENT:  Jennifer Ellis  55 y.o. 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  The patient was taken to the operating room.  After general anesthesia the left leg was prepped and draped.  Timeout was completed.  A standard lateral portal was established and a diagnostic arthroscopy was performed.  I established a medial portal with a spinal needle and then reevaluated the knee and used a probe to palpate the intra-articular structures.  Everything was pretty normal except for the posterior horn root tear.  I released the inner portion of the MCL to allow for better visualization and access to the posterior horn.  I debrided the tear with a straight biter shaver and ArthroCare wand.  At this point I was either going to have to do a subtotal meniscectomy of the posterior horn or attempt repair  I decided to give the patient the best chance for reasonable recovery of good knee function without further deterioration of the medial compartment to repair the meniscus.  We placed 2 meniscal cinch's through a separate medial portal this gave excellent reapproximation of the posterior horn  Probing confirmed stable posterior horn  The knee was then  irrigated closed with 3-0 nylon sutures and then injected with Marcaine with epinephrine for a total of 60 cc  Sterile dressings were applied  The patient was extubated taken to recovery room in stable condition  Postop plan  Weight-bear as tolerated with the knee in extension to improve hoop stresses and repair healing  Limited range of motion 0-90 for 6 weeks    PHYSICIAN ASSISTANT:   ASSISTANTS: None  ANESTHESIA:   general  EBL:  none   BLOOD ADMINISTERED:none  DRAINS: none   LOCAL MEDICATIONS USED:  MARCAINE     SPECIMEN:  No Specimen  DISPOSITION OF SPECIMEN:  N/A  COUNTS:  YES  TOURNIQUET:  * Missing tourniquet times found for documented tourniquets in log: [id number] *  DICTATION: .Dragon Dictation  PLAN OF CARE: Discharge to home after PACU  PATIENT DISPOSITION:  PACU - hemodynamically stable.   Delay start of Pharmacological VTE agent (>24hrs) due to surgical blood loss or risk of bleeding: not applicable  

## 2021-07-04 NOTE — Interval H&P Note (Signed)
History and Physical Interval Note:  07/04/2021 8:35 AM  Jennifer Ellis  has presented today for surgery, with the diagnosis of Left knee medial meniscus tear.  The various methods of treatment have been discussed with the patient and family. After consideration of risks, benefits and other options for treatment, the patient has consented to  Procedure(s): KNEE ARTHROSCOPY WITH MEDIAL MENISCECTOMY (Left) as a surgical intervention.  The patient's history has been reviewed, patient examined, no change in status, stable for surgery.  I have reviewed the patient's chart and labs.  Questions were answered to the patient's satisfaction.     Fuller Canada

## 2021-07-04 NOTE — Anesthesia Procedure Notes (Signed)
Procedure Name: LMA Insertion Date/Time: 07/04/2021 8:55 AM Performed by: Brynda Peon, CRNA Pre-anesthesia Checklist: Patient identified, Emergency Drugs available, Suction available, Patient being monitored and Timeout performed Patient Re-evaluated:Patient Re-evaluated prior to induction Oxygen Delivery Method: Circle system utilized Preoxygenation: Pre-oxygenation with 100% oxygen Induction Type: IV induction LMA: LMA inserted LMA Size: 4.0 Number of attempts: 1 Placement Confirmation: positive ETCO2, CO2 detector and breath sounds checked- equal and bilateral Tube secured with: Tape Dental Injury: Teeth and Oropharynx as per pre-operative assessment

## 2021-07-04 NOTE — Interval H&P Note (Signed)
History and Physical Interval Note:  07/04/2021 8:36 AM  Jennifer Ellis  has presented today for surgery, with the diagnosis of Left knee medial meniscus tear.  The various methods of treatment have been discussed with the patient and family. After consideration of risks, benefits and other options for treatment, the patient has consented to  Procedure(s): KNEE ARTHROSCOPY WITH MEDIAL MENISCECTOMY (Left) as a surgical intervention.  The patient's history has been reviewed, patient examined, no change in status, stable for surgery.  I have reviewed the patient's chart and labs.  Questions were answered to the patient's satisfaction.     Fuller Canada

## 2021-07-04 NOTE — Anesthesia Preprocedure Evaluation (Signed)
Anesthesia Evaluation  Patient identified by MRN, date of birth, ID band Patient awake    Reviewed: Allergy & Precautions, H&P , NPO status , Patient's Chart, lab work & pertinent test results, reviewed documented beta blocker date and time   Airway Mallampati: II  TM Distance: >3 FB Neck ROM: full    Dental no notable dental hx.    Pulmonary neg pulmonary ROS,    Pulmonary exam normal breath sounds clear to auscultation       Cardiovascular Exercise Tolerance: Good negative cardio ROS   Rhythm:regular Rate:Normal     Neuro/Psych negative neurological ROS  negative psych ROS   GI/Hepatic negative GI ROS, Neg liver ROS,   Endo/Other  negative endocrine ROS  Renal/GU negative Renal ROS  negative genitourinary   Musculoskeletal   Abdominal   Peds  Hematology negative hematology ROS (+)   Anesthesia Other Findings   Reproductive/Obstetrics negative OB ROS                             Anesthesia Physical Anesthesia Plan  ASA: 2  Anesthesia Plan: General   Post-op Pain Management:    Induction:   PONV Risk Score and Plan: Ondansetron  Airway Management Planned:   Additional Equipment:   Intra-op Plan:   Post-operative Plan:   Informed Consent: I have reviewed the patients History and Physical, chart, labs and discussed the procedure including the risks, benefits and alternatives for the proposed anesthesia with the patient or authorized representative who has indicated his/her understanding and acceptance.     Dental Advisory Given  Plan Discussed with: CRNA  Anesthesia Plan Comments:         Anesthesia Quick Evaluation

## 2021-07-04 NOTE — Op Note (Signed)
07/04/2021  10:25 AM  PATIENT:  Jennifer Ellis  55 y.o. 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  The patient was taken to the operating room.  After general anesthesia the left leg was prepped and draped.  Timeout was completed.  A standard lateral portal was established and a diagnostic arthroscopy was performed.  I established a medial portal with a spinal needle and then reevaluated the knee and used a probe to palpate the intra-articular structures.  Everything was pretty normal except for the posterior horn root tear.  I released the inner portion of the MCL to allow for better visualization and access to the posterior horn.  I debrided the tear with a straight biter shaver and ArthroCare wand.  At this point I was either going to have to do a subtotal meniscectomy of the posterior horn or attempt repair  I decided to give the patient the best chance for reasonable recovery of good knee function without further deterioration of the medial compartment to repair the meniscus.  We placed 2 meniscal cinch's through a separate medial portal this gave excellent reapproximation of the posterior horn  Probing confirmed stable posterior horn  The knee was then  irrigated closed with 3-0 nylon sutures and then injected with Marcaine with epinephrine for a total of 60 cc  Sterile dressings were applied  The patient was extubated taken to recovery room in stable condition  Postop plan  Weight-bear as tolerated with the knee in extension to improve hoop stresses and repair healing  Limited range of motion 0-90 for 6 weeks    PHYSICIAN ASSISTANT:   ASSISTANTS: None  ANESTHESIA:   general  EBL:  none   BLOOD ADMINISTERED:none  DRAINS: none   LOCAL MEDICATIONS USED:  MARCAINE     SPECIMEN:  No Specimen  DISPOSITION OF SPECIMEN:  N/A  COUNTS:  YES  TOURNIQUET:  * Missing tourniquet times found for documented tourniquets in log: [id number] *  DICTATION: .Dragon Dictation  PLAN OF CARE: Discharge to home after PACU  PATIENT DISPOSITION:  PACU - hemodynamically stable.   Delay start of Pharmacological VTE agent (>24hrs) due to surgical blood loss or risk of bleeding: not applicable

## 2021-07-04 NOTE — Anesthesia Postprocedure Evaluation (Signed)
Anesthesia Post Note  Patient: [name]  Procedure(s) Performed: KNEE ARTHROSCOPY WITH PARTIAL MEDIAL MENISCECTOMY AND MEDIAL MENISCUS REPAIR (Left: Knee)  Patient location during evaluation: Phase II Anesthesia Type: General Level of consciousness: awake Pain management: pain level controlled Vital Signs Assessment: post-procedure vital signs reviewed and stable Respiratory status: spontaneous breathing and respiratory function stable Cardiovascular status: blood pressure returned to baseline and stable Postop Assessment: no headache and no apparent nausea or vomiting Anesthetic complications: no Comments: Late entry   No notable events documented.   Last Vitals:  Vitals:   07/04/21 1155 07/04/21 1206  BP: (!) 170/93 (!) 160/91  Pulse: (!) 55   Resp: 16   Temp: 36.4 C   SpO2: 98%     Last Pain:  Vitals:   07/04/21 1230  TempSrc:   PainSc: 4                  Windell Norfolk

## 2021-07-05 ENCOUNTER — Encounter (HOSPITAL_COMMUNITY): Payer: Self-pay | Admitting: Orthopedic Surgery

## 2021-07-10 ENCOUNTER — Encounter: Payer: Self-pay | Admitting: Orthopedic Surgery

## 2021-07-17 ENCOUNTER — Encounter: Payer: Self-pay | Admitting: Orthopedic Surgery

## 2021-07-17 ENCOUNTER — Ambulatory Visit (INDEPENDENT_AMBULATORY_CARE_PROVIDER_SITE_OTHER): Payer: BC Managed Care – PPO | Admitting: Orthopedic Surgery

## 2021-07-17 ENCOUNTER — Other Ambulatory Visit: Payer: Self-pay

## 2021-07-17 DIAGNOSIS — Z9889 Other specified postprocedural states: Secondary | ICD-10-CM | POA: Insufficient documentation

## 2021-07-17 MED ORDER — HYDROCODONE-ACETAMINOPHEN 5-325 MG PO TABS: 1.0000 | ORAL_TABLET | Freq: Four times a day (QID) | ORAL | 0 refills | Status: DC | PRN

## 2021-07-17 NOTE — Progress Notes (Signed)
POST OP   Chief Complaint  Patient presents with   Post-op Follow-up    Left knee arthroscopy Meniscus Repair  07/04/21 using Crutches still c/o pain     Encounter Diagnosis  Name Primary?   S/P lateral meniscus repair of left knee 07/04/21 Yes    PROCEDURE: Repair  POV #1 postop day 13  Meds related: Hydrocodone ibuprofen  Sutures taken out quite a bit of swelling still has 0 to 90 degree range of motion  Walking with crutches  Out of work 12 weeks from surgery  Refill hydrocodone continue ibuprofen  Follow-up in 4 weeks  Meds ordered this encounter  Medications   HYDROcodone-acetaminophen (NORCO/VICODIN) 5-325 MG tablet    Sig: Take 1 tablet by mouth every 6 (six) hours as needed for moderate pain.    Dispense:  30 tablet    Refill:  0

## 2021-07-17 NOTE — Patient Instructions (Signed)
OOW 12 weeks from surgery

## 2021-07-17 NOTE — Progress Notes (Signed)
p 

## 2021-08-14 ENCOUNTER — Other Ambulatory Visit: Payer: Self-pay

## 2021-08-14 ENCOUNTER — Ambulatory Visit (INDEPENDENT_AMBULATORY_CARE_PROVIDER_SITE_OTHER): Payer: BC Managed Care – PPO | Admitting: Orthopedic Surgery

## 2021-08-14 DIAGNOSIS — Z9889 Other specified postprocedural states: Secondary | ICD-10-CM

## 2021-08-14 NOTE — Progress Notes (Signed)
POST OP  Chief Complaint  Patient presents with   Routine Post Op    S/p lateral meniscus repair of left knee//DOS 07/04/21   Days 41/wks 6   Jennifer Ellis is doing well. She was able to walk on the treadmill 1 mi (preop did 3 mi) she hasnt iced the knee in 2 weeks   Exam  left knee:  Full ext no lag Normal quad strength  No swelling and no tenderness  Advice Slow return to normal  Stay a 1 mile on treadmill  Cryocuff 1-2 x a day   3 wk fu

## 2021-09-04 ENCOUNTER — Ambulatory Visit (INDEPENDENT_AMBULATORY_CARE_PROVIDER_SITE_OTHER): Payer: BC Managed Care – PPO | Admitting: Orthopedic Surgery

## 2021-09-04 ENCOUNTER — Other Ambulatory Visit: Payer: Self-pay

## 2021-09-04 ENCOUNTER — Encounter: Payer: Self-pay | Admitting: Orthopedic Surgery

## 2021-09-04 VITALS — BP 178/107 | HR 75

## 2021-09-04 DIAGNOSIS — Z4889 Encounter for other specified surgical aftercare: Secondary | ICD-10-CM

## 2021-09-04 DIAGNOSIS — M25562 Pain in left knee: Secondary | ICD-10-CM

## 2021-09-04 DIAGNOSIS — Z9889 Other specified postprocedural states: Secondary | ICD-10-CM

## 2021-09-04 MED ORDER — HYDROCODONE-ACETAMINOPHEN 5-325 MG PO TABS: 1.0000 | ORAL_TABLET | Freq: Four times a day (QID) | ORAL | 0 refills | Status: DC | PRN

## 2021-09-04 MED ORDER — PREDNISONE 10 MG (48) PO TBPK: ORAL_TABLET | Freq: Every day | ORAL | 0 refills | Status: DC

## 2021-09-04 NOTE — Patient Instructions (Addendum)
Check patients FMLA , OOW SAY SOCT 4 AND FMLA SAYS SOMETHING DIFFERENT

## 2021-09-04 NOTE — Progress Notes (Signed)
Chief Complaint  Patient presents with   Knee Pain    L/DOS 07/04/21 If I sit down or lay down for too long when I get up it hurts for me to walk. Still having pain in that same area.     Encounter Diagnoses  Name Primary?   Status post medial meniscus repair of left knee Yes   Aftercare following surgery     55 year old female status post meniscal repair medial meniscus 2 months ago  She complains of pain and swelling with pain sitting not standing or walking.  She says is hard to get started after she has been sitting down and thinks the knee may be swollen  Exam shows normal gait but she does have an effusion  We did aspirate inject the left knee with her permission we used a 22-gauge spinal needle without back 10 cc of fluid injected it with Celestone and lidocaine  Recommend hydrocodone and prednisone see me in 3 weeks  Patient out of work through October 4  A steroid injection was performed after aspiration of the left knee.  I attempted standard aspiration through the lateral superolateral area but could not get into the joint and therefore used 10 cc of 1% lidocaine and then came back in 5 minutes and used a 21-gauge spinal needle to get into the joint  Then using 1% plain Lidocaine and 6 mg of Celestone. This was well tolerated.   Meds ordered this encounter  Medications   predniSONE (STERAPRED UNI-PAK 48 TAB) 10 MG (48) TBPK tablet    Sig: Take by mouth daily. 10 mg 12 days    Dispense:  48 tablet    Refill:  0   HYDROcodone-acetaminophen (NORCO/VICODIN) 5-325 MG tablet    Sig: Take 1 tablet by mouth every 6 (six) hours as needed for moderate pain.    Dispense:  30 tablet    Refill:  0

## 2021-09-25 ENCOUNTER — Ambulatory Visit (INDEPENDENT_AMBULATORY_CARE_PROVIDER_SITE_OTHER): Payer: BC Managed Care – PPO | Admitting: Orthopedic Surgery

## 2021-09-25 ENCOUNTER — Encounter: Payer: Self-pay | Admitting: Orthopedic Surgery

## 2021-09-25 ENCOUNTER — Other Ambulatory Visit: Payer: Self-pay

## 2021-09-25 DIAGNOSIS — Z9889 Other specified postprocedural states: Secondary | ICD-10-CM

## 2021-09-25 MED ORDER — IBUPROFEN 800 MG PO TABS: 800.0000 mg | ORAL_TABLET | Freq: Three times a day (TID) | ORAL | 5 refills | Status: DC | PRN

## 2021-09-25 NOTE — Patient Instructions (Signed)
RTW ASAP

## 2021-09-25 NOTE — Progress Notes (Signed)
Chief Complaint  Patient presents with   Post-op Follow-up    Left knee much better s/p meniscus repair 07/04/21   Jennifer Ellis is here after seeing her on 12 September with recurrent pain and swelling of the left knee.  We did aspirate and inject the knee and put her on steroids and some medication she says her knee is much better she is regained her range of motion she would like some ibuprofen for when she goes back to work  Reexamination of the knees show she has full range of motion good quadriceps control no swelling no pain  Recommend ibuprofen 800 mg 3 times daily as needed  She can return to work as soon as possible  Follow-up as needed

## 2022-01-18 ENCOUNTER — Encounter: Payer: Self-pay | Admitting: Orthopedic Surgery

## 2022-01-18 ENCOUNTER — Other Ambulatory Visit: Payer: Self-pay

## 2022-01-18 ENCOUNTER — Ambulatory Visit: Payer: BC Managed Care – PPO

## 2022-01-18 ENCOUNTER — Ambulatory Visit (INDEPENDENT_AMBULATORY_CARE_PROVIDER_SITE_OTHER): Payer: BC Managed Care – PPO | Admitting: Orthopedic Surgery

## 2022-01-18 DIAGNOSIS — M25462 Effusion, left knee: Secondary | ICD-10-CM | POA: Diagnosis not present

## 2022-01-18 DIAGNOSIS — M25562 Pain in left knee: Secondary | ICD-10-CM | POA: Diagnosis not present

## 2022-01-18 DIAGNOSIS — Z9889 Other specified postprocedural states: Secondary | ICD-10-CM | POA: Diagnosis not present

## 2022-01-18 MED ORDER — MELOXICAM 7.5 MG PO TABS: 7.5000 mg | ORAL_TABLET | Freq: Every day | ORAL | 5 refills | Status: DC

## 2022-01-18 NOTE — Patient Instructions (Signed)
While we are working on your approval for MRI please go ahead and call to schedule your appointment with [facility] within at least one (1) week.   Central Scheduling [phone]  

## 2022-01-18 NOTE — Progress Notes (Signed)
Chief Complaint  Patient presents with   Knee Pain    Left     HPI: 56 year old female had meniscal repair left knee July 2022.  We saw her back in October with pain and swelling treated with ibuprofen and steroids did well but comes in today saying that it is hard for her to walk she is having trouble getting her knee back to its previous preinjury status she has a lot of pain and swelling  She took ibuprofen it does not seem to help    Past Medical History:  Diagnosis Date   Arthritis     General appearance: Well-developed well-nourished no gross deformities  Cardiovascular normal pulse and perfusion normal color without edema  Neurologically no sensation loss or deficits or pathologic reflexes  Psychological: Awake alert and oriented x3 mood and affect normal  Skin no lacerations or ulcerations no nodularity no palpable masses, no erythema or nodularity  Musculoskeletal: She appears to have an effusion of the left knee although I tapped it and it was dry  She has tenderness on the medial side mainly some lateral and patellar tendon tenderness  X-ray left knee shows mild arthritis with effusion  Recommend meloxicam as she did not want to take steroids and also recommend repeat MRI left knee to check the meniscal repair site  Meds ordered this encounter  Medications   meloxicam (MOBIC) 7.5 MG tablet    Sig: Take 1 tablet (7.5 mg total) by mouth daily.    Dispense:  30 tablet    Refill:  5

## 2022-01-18 NOTE — Addendum Note (Signed)
Addended byCaffie Damme on: 01/18/2022 11:24 AM   Modules accepted: Orders

## 2022-01-31 ENCOUNTER — Other Ambulatory Visit: Payer: Self-pay

## 2022-01-31 ENCOUNTER — Ambulatory Visit (HOSPITAL_COMMUNITY)
Admission: RE | Admit: 2022-01-31 | Discharge: 2022-01-31 | Disposition: A | Discharge status: Home / Self Care | Payer: BC Managed Care – PPO | Source: Ambulatory Visit | Attending: Orthopaedic Surgery | Admitting: Orthopaedic Surgery

## 2022-01-31 DIAGNOSIS — Z9889 Other specified postprocedural states: Secondary | ICD-10-CM | POA: Diagnosis present

## 2022-01-31 DIAGNOSIS — M25462 Effusion, left knee: Secondary | ICD-10-CM | POA: Insufficient documentation

## 2022-01-31 DIAGNOSIS — M25562 Pain in left knee: Secondary | ICD-10-CM | POA: Insufficient documentation

## 2022-02-08 ENCOUNTER — Ambulatory Visit: Payer: BC Managed Care – PPO | Admitting: Orthopedic Surgery

## 2022-02-15 ENCOUNTER — Other Ambulatory Visit: Payer: Self-pay

## 2022-02-15 ENCOUNTER — Other Ambulatory Visit: Payer: Self-pay | Admitting: Orthopedic Surgery

## 2022-02-15 ENCOUNTER — Ambulatory Visit: Payer: BC Managed Care – PPO | Admitting: Orthopedic Surgery

## 2022-02-15 DIAGNOSIS — M25562 Pain in left knee: Secondary | ICD-10-CM

## 2022-02-15 DIAGNOSIS — Z9889 Other specified postprocedural states: Secondary | ICD-10-CM | POA: Diagnosis not present

## 2022-02-15 DIAGNOSIS — G8929 Other chronic pain: Secondary | ICD-10-CM | POA: Diagnosis not present

## 2022-02-15 DIAGNOSIS — M1712 Unilateral primary osteoarthritis, left knee: Secondary | ICD-10-CM

## 2022-02-15 MED ORDER — CELECOXIB 200 MG PO CAPS: 200.0000 mg | ORAL_CAPSULE | Freq: Two times a day (BID) | ORAL | 2 refills | Status: DC

## 2022-02-15 NOTE — Progress Notes (Signed)
Chief Complaint  Patient presents with   Knee Pain    LT knee// MRI results    Ms. Jennifer Ellis had meniscal repair in July 2022 with the meniscal cinch it was a root repair she initially did well but has progressively noticed increased swelling and pain in her left knee.  She was treated with anti-inflammatories injections but still seem to have pain and swelling so we sent her for MRI  The meniscal repair healed but she is developing progressively rapid onset of arthritis especially of the medial compartment.  Her MRI report will be listed below.  But she basically has by my account high-grade partial and full-thickness cartilage loss on the medial femoral condyle in the weightbearing surface and she has some mild patellar femoral thinning as well  I think most of her symptoms are now from arthritis as the meniscal repair seems to have healed  We discussed possible treatment options and as she has already been on prednisone and meloxicam we will try Celebrex instead we will also inquire about hyaluronic acid injections and encouraged weight loss and exercise and knee strengthening  Encounter Diagnoses  Name Primary?   S/P medial meniscus repair of left knee Yes   Chronic pain of left knee      Meds ordered this encounter  Medications   celecoxib (CELEBREX) 200 MG capsule    Sig: Take 1 capsule (200 mg total) by mouth 2 (two) times daily.    Dispense:  60 capsule    Refill:  2

## 2022-08-19 IMAGING — MR MR KNEE*L* W/O CM
7 series · 40 of 40 positions shown · non-contrast
Comparison: Left knee radiographs 01/18/2022;

CLINICAL DATA: Chronic left knee pain. Status post meniscus repair
07/04/2021.

EXAM:
MRI OF THE LEFT KNEE WITHOUT CONTRAST
TECHNIQUE: Multiplanar, multisequence MR imaging of the knee was performed. No
intravenous contrast was administered.

[Series 9: T1 · coronal · left · 4.0mm · 0.66mm/px · 5 of 30 slices shown]
[im 1/30]
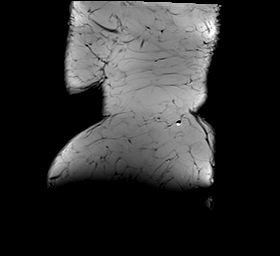
[im 8/30]
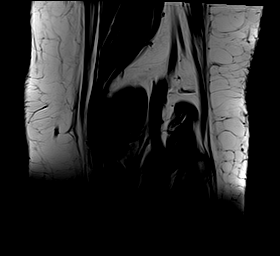
[im 15/30]
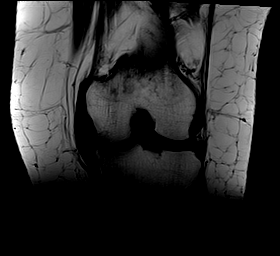
[im 22/30]
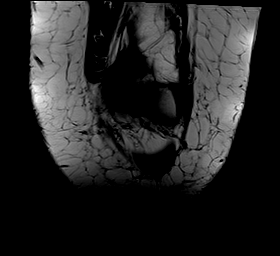
[im 30/30]
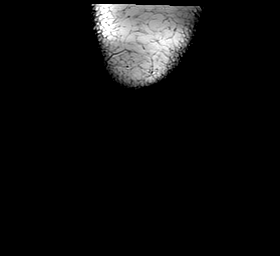

[Series 10: T2 fat-sat · coronal · left · 4.0mm · 0.66mm/px · 6 of 30 slices shown (1 of 3)]
[im 1/30]
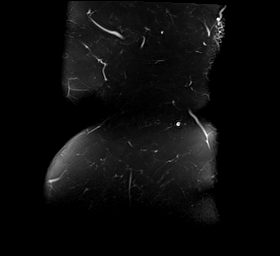
[im 6/30]
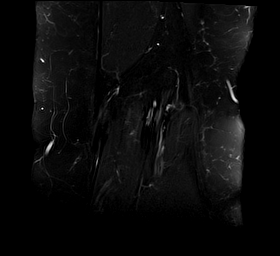
[im 12/30]
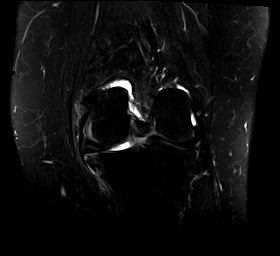
[im 18/30]
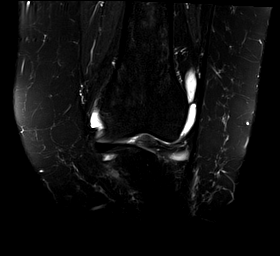
[im 24/30]
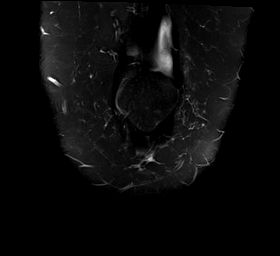
[im 30/30]
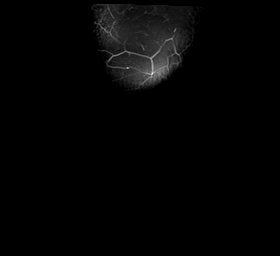

[Series 11: PD fat-sat · coronal · left · 4.0mm · 0.66mm/px · 6 of 30 slices shown (1 of 2)]
[im 1/30]
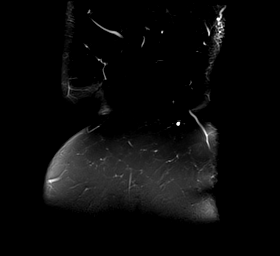
[im 6/30]
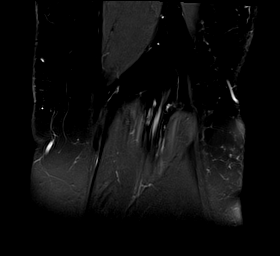
[im 12/30]
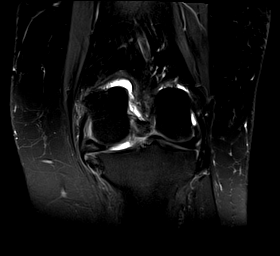
[im 18/30]
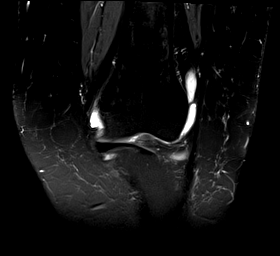
[im 24/30]
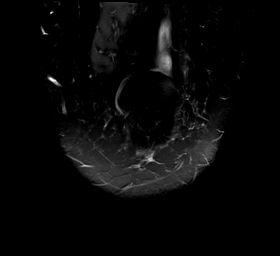
[im 30/30]
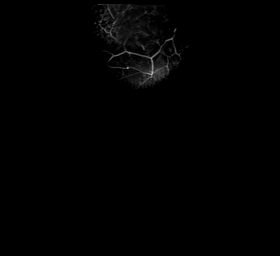

[Series 12: PD fat-sat · sagittal · left · 3.0mm · 0.61mm/px · 7 of 34 slices shown (2 of 2)]
[im 1/34]
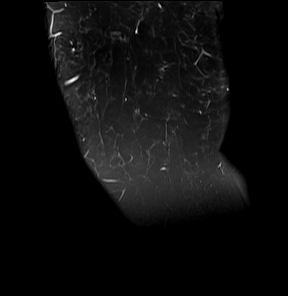
[im 6/34]
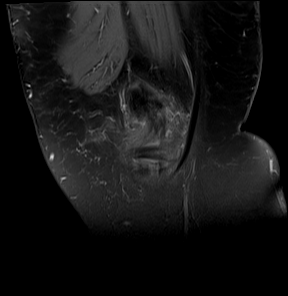
[im 12/34]
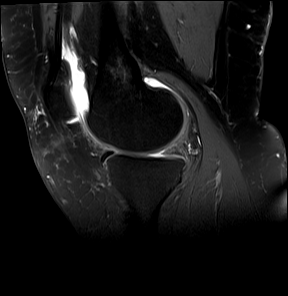
[im 17/34]
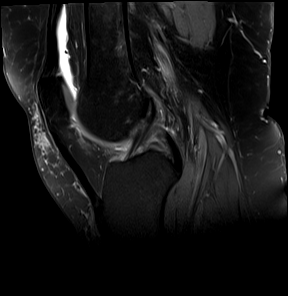
[im 23/34]
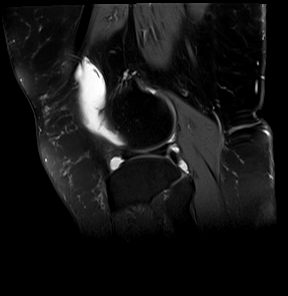
[im 28/34]
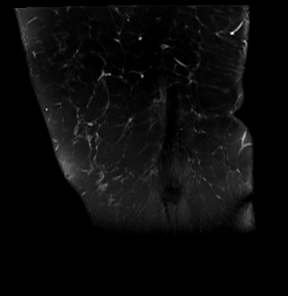
[im 34/34]
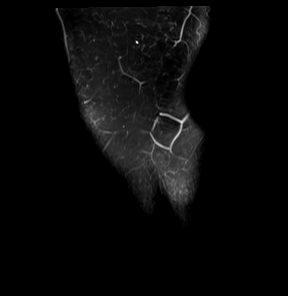

[Series 13: T2 fat-sat · sagittal · left · 3.0mm · 0.68mm/px · 7 of 34 slices shown (2 of 3)]
[im 1/34]
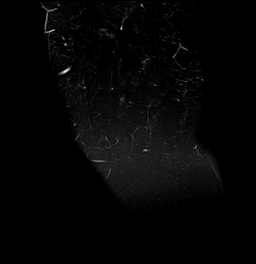
[im 6/34]
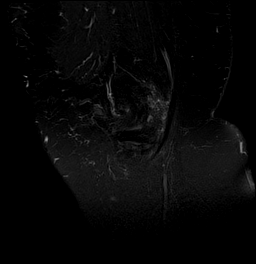
[im 12/34]
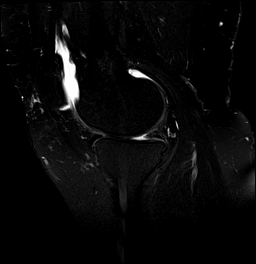
[im 17/34]
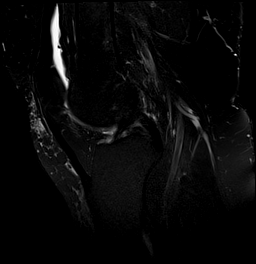
[im 23/34]
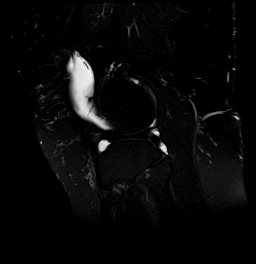
[im 28/34]
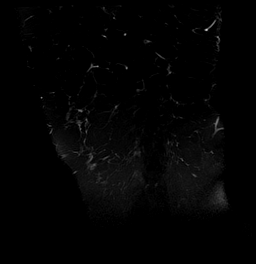
[im 34/34]
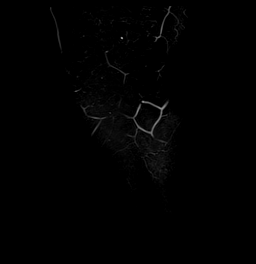

[Series 14: PD · coronal · left · 2.0mm · 0.47mm/px · 3 of 16 slices shown]
[im 1/16]
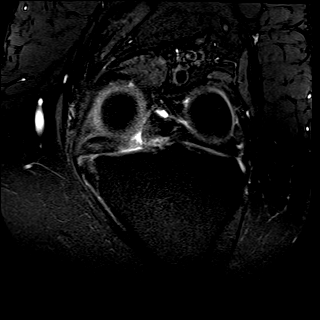
[im 8/16]
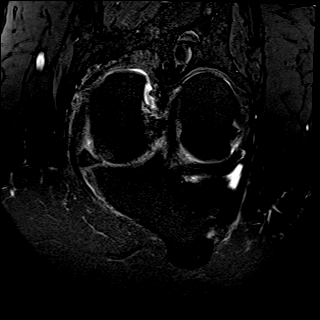
[im 16/16]
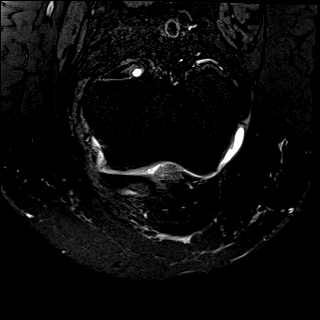

[Series 15: T2 fat-sat · axial · left · 4.0mm · 0.59mm/px · z∈[-71,+62]mm · 6 of 28 slices shown (3 of 3)]
[im 1/28]
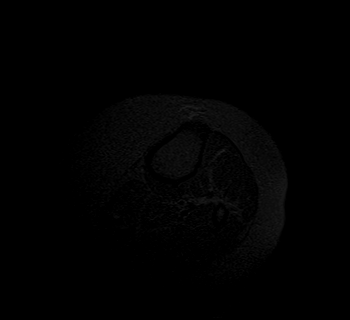
[im 6/28]
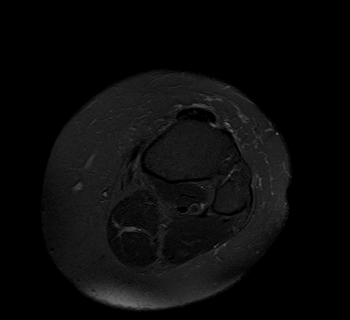
[im 11/28]
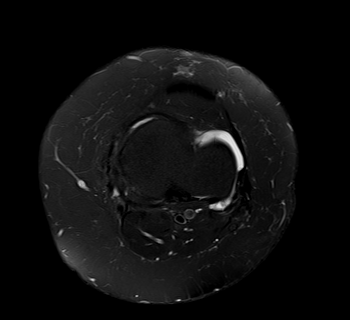
[im 17/28]
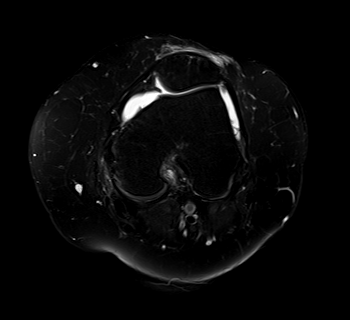
[im 22/28]
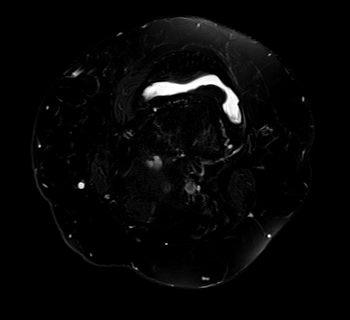
[im 28/28]
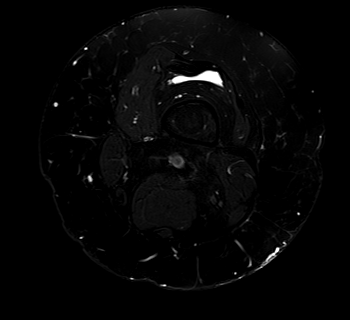

[40 of 40 positions shown; findings below may reference images not displayed]

FINDINGS: MENISCI

Medial meniscus: There is diffuse increased proton density signal
seen throughout the posterior horn of the medial meniscus, greatest
at the root where there is superior articular surface degenerative
irregularity and superior greater than inferior articular surface
superficial tears. a there is a tiny divot within the inferior
articular surface of the free edge of the more medial aspect of the
posterior horn (sagittal image 13). Moderate intermediate proton
density signal and swelling of the junction of the body and
posterior horn of the medial meniscus. Overall mild-to-moderate
extrusion of the body of the medial meniscus.

Lateral meniscus: Mild to moderate anterior and mild posterior
intermediate proton density signal diffuse intrasubstance
degeneration. No tear is seen extending through the articular
surface of the lateral meniscus.

LIGAMENTS

Cruciates: The ACL and PCL are intact.

Collaterals: There is mild-to-moderate thickening and intermediate
T2 signal of the deep greater than superficial component of the
proximal medial collateral ligament, likely a sprain. No fluid
bright tear or ligament retraction is seen. The fibular collateral
ligament, biceps femoris tendon, iliotibial band, and popliteus
tendon are intact.

CARTILAGE

Patellofemoral: Mild thinning of the superomedial aspect of the
medial trochlear cartilage.

Medial: High-grade partial and full-thickness cartilage loss is seen
within the far medial aspect of the medial femoral condyle greater
than medial tibial plateau. Moderate medial femoral condyle and mild
medial tibial plateau subchondral marrow edema. Moderate thinning of
the mid transverse dimension of the weight-bearing medial femoral
condyle cartilage.

Lateral: Mild thinning of the lateral aspect of the weight-bearing
lateral femoral condyle cartilage.

Joint: Moderatejoint effusion. Normal Hoffa's fat pad. No plical
thickening.

Popliteal Fossa:  No Baker's cyst.

Extensor Mechanism:  Intact quadriceps tendon and patellar tendon.

Bones:  No acute fracture or dislocation.

Other: None.
IMPRESSION: :
IMPRESSION: 1. Moderate to high-grade thinning of the far medial aspect of the
weight-bearing medial femoral condyle with moderate subchondral
marrow edema.
2. Degenerative changes and superior greater than inferior articular
surface tears of the root of the posterior horn of the medial
meniscus. Intrasubstance degeneration and swelling of the junction
of the body and posterior horn of the medial meniscus with mild to
moderate extrusion of the body of the medial meniscus.
3. Mild-to-moderate proximal medial collateral ligament sprain.
4. Moderate joint effusion.

## 2023-02-21 ENCOUNTER — Encounter: Payer: Self-pay | Admitting: Radiology

## 2023-06-24 ENCOUNTER — Encounter: Payer: Self-pay | Admitting: Neurology

## 2023-07-23 ENCOUNTER — Ambulatory Visit: Payer: BC Managed Care – PPO | Admitting: Neurology

## 2023-07-23 ENCOUNTER — Encounter: Payer: Self-pay | Admitting: Neurology

## 2023-07-23 VITALS — BP 172/100 | HR 67 | Ht 63.0 in | Wt 213.0 lb

## 2023-07-23 DIAGNOSIS — R200 Anesthesia of skin: Secondary | ICD-10-CM

## 2023-07-23 NOTE — Patient Instructions (Signed)
Nerve testing of both arms  ELECTROMYOGRAM AND NERVE CONDUCTION STUDIES (EMG/NCS) INSTRUCTIONS  How to Prepare The neurologist conducting the EMG will need to know if you have certain medical conditions. Tell the neurologist and other EMG lab personnel if you: . Have a pacemaker or any other electrical medical device . Take blood-thinning medications . Have hemophilia, a blood-clotting disorder that causes prolonged bleeding Bathing Take a shower or bath shortly before your exam in order to remove oils from your skin. Don't apply lotions or creams before the exam.  What to Expect You'll likely be asked to change into a hospital gown for the procedure and lie down on an examination table. The following explanations can help you understand what will happen during the exam.  . Electrodes. The neurologist or a technician places surface electrodes at various locations on your skin depending on where you're experiencing symptoms. Or the neurologist may insert needle electrodes at different sites depending on your symptoms.  . Sensations. The electrodes will at times transmit a tiny electrical current that you may feel as a twinge or spasm. The needle electrode may cause discomfort or pain that usually ends shortly after the needle is removed. If you are concerned about discomfort or pain, you may want to talk to the neurologist about taking a short break during the exam.  . Instructions. During the needle EMG, the neurologist will assess whether there is any spontaneous electrical activity when the muscle is at rest - activity that isn't present in healthy muscle tissue - and the degree of activity when you slightly contract the muscle.  He or she will give you instructions on resting and contracting a muscle at appropriate times. Depending on what muscles and nerves the neurologist is examining, he or she may ask you to change positions during the exam.  After your EMG You may experience some  temporary, minor bruising where the needle electrode was inserted into your muscle. This bruising should fade within several days. If it persists, contact your primary care doctor.    

## 2023-07-23 NOTE — Progress Notes (Signed)
[facility] Neurology Division Clinic Note - Initial Visit   Date: 07/23/2023   Jennifer Ellis MRN: [id number] DOB: Jul 07, 1966   Dear Dr. Reuel Boom:  Thank you for your kind referral of Jennifer Ellis for consultation of bilateral hand numbness. Although her history is well known to you, please allow [place] to reiterate it for the purpose of our medical record. The patient was accompanied to the clinic by son who also provides collateral information.     Jennifer Ellis is a 57 y.o. right-handed female with presenting for evaluation of bilateral hand numbness.   IMPRESSION/PLAN: Bilateral hand numbness.  Exam shows mild distal hand weakness, no sensory loss and Tinel's at the wrist an elbow is negative.  NCS/EMG will be ordered to evaluate for entrapment neuropathy vs radiculopathy. Elevated blood pressure.  She is asymptomtic.  BP markedly elevated at 196/118 and rechecked manually 172/100.  She reports BP is much better at home ranging 130s/90s.  I have instructed her to monitor at home and if he remains elevated, to follow-up with PCP.    Further recommendations pending results.   ------------------------------------------------------------- History of present illness: Starting around March 2024, she began having numbness in the hands, worse in the right hand.  Numbness is constant.  Symptoms do not wake her up from sleeping.  She is dropping objects from the right hand because of weakness.  PCP recommended that she wear a wrist brace which she wore for about a month, but did not appreciate any improvement. She endorses neck pain which radiates down the right arm.   No numbness/tingling in the feet or legs.   She works at [employer].  She is a nonsmoker, does not drink alcohol, and no history of diabetes.  Her blood pressure is elevated today.  She monitors this at home and says that it is usually 130s/90s.  She denies vision changes, chest pain, or shortness of breath.    Past  Medical History:  Diagnosis Date   Arthritis     Past Surgical History:  Procedure Laterality Date   CESAREAN SECTION     KNEE ARTHROSCOPY WITH MEDIAL MENISECTOMY Left 07/04/2021   Procedure: KNEE ARTHROSCOPY WITH PARTIAL MEDIAL MENISCECTOMY AND MEDIAL MENISCUS REPAIR;  Surgeon: Vickki Hearing, MD;  Location: [facility];  Service: Orthopedics;  Laterality: Left;   tummy tuck       Medications:  Outpatient Encounter Medications as of 07/23/2023  Medication Sig   BLACK CURRANT SEED OIL PO Take by mouth.   magnesium 30 MG tablet Take 30 mg by mouth 2 (two) times daily.   Multiple Vitamins-Minerals (MULTIVITAMIN WITH MINERALS) tablet Take 1 tablet by mouth daily.   OVER THE COUNTER MEDICATION Sea Moss   Turmeric 500 MG CAPS Take 500 mg by mouth daily.   celecoxib (CELEBREX) 200 MG capsule Take 1 capsule (200 mg total) by mouth 2 (two) times daily.   ibuprofen (ADVIL) 800 MG tablet Take 1 tablet (800 mg total) by mouth every 8 (eight) hours as needed.   meloxicam (MOBIC) 7.5 MG tablet Take 1 tablet (7.5 mg total) by mouth daily.   No facility-administered encounter medications on file as of 07/23/2023.    Allergies:  Allergies  Allergen Reactions   Lisinopril Rash    "It gave me a rash".     Family History: Family History  Problem Relation Age of Onset   Kidney failure Mother    COPD Mother    Congestive Heart Failure Mother  Hypertension Father     Social History: Social History   Tobacco Use   Smoking status: Never   Smokeless tobacco: Never  Substance Use Topics   Alcohol use: No   Drug use: No   Social History   Social History Narrative   ** Merged History Encounter **       Are you right handed or left handed? Right Handed    Are you currently employed ? 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 No    Who lives with you? With husband    What type of home do you live in: 1 story or 2 story? Lives in a one story home          Vital Signs:  BP (!) 196/118   Pulse 67   Ht 5\' 3"  (1.6 m)   Wt 213 lb (96.6 kg)   SpO2 96%   BMI 37.73 kg/m   Neurological Exam: MENTAL STATUS including orientation to time, place, person, recent and remote memory, attention span and concentration, language, and fund of knowledge is normal.  Speech is not dysarthric.  CRANIAL NERVES: II:  No visual field defects.     III-IV-VI: Pupils equal round and reactive to light.  Normal conjugate, extra-ocular eye movements in all directions of gaze.  No nystagmus.  No ptosis.   V:  Normal facial sensation.    VII:  Normal facial symmetry and movements.   VIII:  Normal hearing and vestibular function.   IX-X:  Normal palatal movement.   XI:  Normal shoulder shrug and head rotation.   XII:  Normal tongue strength and range of motion, no deviation or fasciculation.  MOTOR:  No atrophy, fasciculations or abnormal movements.  No pronator drift.   Upper Extremity:  Right  Left  Deltoid  5/5   5/5   Biceps  5/5   5/5   Triceps  5/5   5/5   Wrist extensors  5/5   5/5   Wrist flexors  5/5   5/5   Finger extensors  5/5   5/5   Finger flexors  5/5   5/5   Dorsal interossei  4/5   5-/5   Abductor pollicis  4/5   5-/5   Tone (Ashworth scale)  0  0   Lower Extremity:  Right  Left  Hip flexors  5/5   5/5   Hip extensors  5/5   5/5   Knee flexors  5/5   5/5   Knee extensors  5/5   5/5   Dorsiflexors  5/5   5/5   Plantarflexors  5/5   5/5   Toe extensors  5/5   5/5   Toe flexors  5/5   5/5   Tone (Ashworth scale)  0  0   MSRs:                                           Right        Left brachioradialis 2+  2+  biceps 2+  2+  triceps 2+  2+  patellar 2+  2+  ankle jerk 2+  2+  Hoffman no  no  plantar response down  down   SENSORY:  Normal and symmetric perception of light touch, pinprick, vibration, and temperature.  Romberg's sign absent. Tinel's  sign negative at the wrists and elbow.   COORDINATION/GAIT: Normal finger-to-  nose-finger.  Intact rapid alternating movements bilaterally.  Able to rise from a chair without using arms.  Gait narrow based and stable. Unsteadiness with tandem gait.  Stressed gait intact.     Thank you for allowing me to participate in patient's care.  If I can answer any additional questions, I would be pleased to do so.    Sincerely,    Melesa Lecy K. Allena Katz, DO

## 2023-08-09 ENCOUNTER — Ambulatory Visit: Payer: BC Managed Care – PPO | Admitting: Neurology

## 2023-08-09 DIAGNOSIS — R200 Anesthesia of skin: Secondary | ICD-10-CM

## 2023-08-09 DIAGNOSIS — G5601 Carpal tunnel syndrome, right upper limb: Secondary | ICD-10-CM

## 2023-08-09 NOTE — Procedures (Signed)
[facility] Neurology  [address], [address]  [place], [place] [address] Tel: [phone] Fax: [phone] Test Date:  08/09/2023  Patient: Jennifer Ellis DOB: 02/20/1966 Physician: Nita Sickle, DO  Sex: Female Height: 5\' 3"  Ref Phys: Nita Sickle, DO  ID#: [id number]   Technician:    History: This is a 57 year old female referred for evaluation of bilateral hand paresthesias.  NCV & EMG Findings: Extensive electrodiagnostic testing of the right upper extremity and additional studies of the left shows:  Right mixed palmar sensory responses show prolonged latency.  Left mixed palmar, bilateral median, and bilateral ulnar sensory responses are within normal limits.   Bilateral median and ulnar motor responses are within normal limits.   There is no evidence of active or chronic motor axonal loss changes affecting any of the tested muscles.  Motor unit configuration and recruitment pattern is within normal limits.    Impression: Right median neuropathy at or distal to the wrist, consistent with a clinical diagnosis of carpal tunnel syndrome.  Overall, these findings are very mild in degree electrically. There is no evidence of a cervical radiculopathy affecting either upper extremity.   ___________________________ Nita Sickle, DO    Nerve Conduction Studies   Stim Site NR Peak (ms) Norm Peak (ms) O-P Amp (V) Norm O-P Amp  Left Median Anti Sensory (2nd Digit)  32 C  Wrist    3.1 <3.6 39.6 >15  Right Median Anti Sensory (2nd Digit)  32 C  Wrist    3.4 <3.6 26.7 >15  Left Ulnar Anti Sensory (5th Digit)  32 C  Wrist    2.5 <3.1 40.4 >10  Right Ulnar Anti Sensory (5th Digit)  32 C  Wrist    2.4 <3.1 47.0 >10     Stim Site NR Onset (ms) Norm Onset (ms) O-P Amp (mV) Norm O-P Amp Site1 Site2 Delta-0 (ms) Dist (cm) Vel (m/s) Norm Vel (m/s)  Left Median Motor (Abd Poll Brev)  32 C  Wrist    3.2 <4.0 10.1 >6 Elbow Wrist 4.5 29.0 64 >50  Elbow    7.7  9.9         Right  Median Motor (Abd Poll Brev)  32 C  Wrist    3.5 <4.0 11.0 >6 Elbow Wrist 4.5 28.0 62 >50  Elbow    8.0  10.8         Left Ulnar Motor (Abd Dig Minimi)  32 C  Wrist    2.2 <3.1 10.6 >7 B Elbow Wrist 3.1 21.0 65 >50  B Elbow    5.3  10.3  A Elbow B Elbow 1.4 8.0 66 >50  A Elbow    6.7  10.0         Right Ulnar Motor (Abd Dig Minimi)  32 C  Wrist    2.3 <3.1 11.2 >7 B Elbow Wrist 3.2 21.0 66 >50  B Elbow    5.5  10.9  A Elbow B Elbow 1.2 8.0 67 >50  A Elbow    6.7  10.6            Stim Site NR Peak (ms) Norm Peak (ms) P-T Amp (V) Site1 Site2 Delta-P (ms) Norm Delta (ms)  Left Median/Ulnar Palm Comparison (Wrist - 8cm)  32 C  Median Palm    1.8 <2.2 64.0 Median Palm Ulnar Palm 0.3   Ulnar Palm    1.5 <2.2 12.5      Right Median/Ulnar Palm Comparison (Wrist - 8cm)  32 C  Median Palm    2.0 <2.2 55.8 Median Palm Ulnar Palm *0.6   Ulnar Palm    1.4 <2.2 11.7       Electromyography   Side Muscle Ins.Act Fibs Fasc Recrt Amp Dur Poly Activation Comment  Right 1stDorInt Nml Nml Nml Nml Nml Nml Nml Nml N/A  Right Abd Poll Brev Nml Nml Nml Nml Nml Nml Nml Nml N/A  Right PronatorTeres Nml Nml Nml Nml Nml Nml Nml Nml N/A  Right Biceps Nml Nml Nml Nml Nml Nml Nml Nml N/A  Right Triceps Nml Nml Nml Nml Nml Nml Nml Nml N/A  Right Deltoid Nml Nml Nml Nml Nml Nml Nml Nml N/A  Left 1stDorInt Nml Nml Nml Nml Nml Nml Nml Nml N/A  Left Abd Poll Brev Nml Nml Nml Nml Nml Nml Nml Nml N/A  Left PronatorTeres Nml Nml Nml Nml Nml Nml Nml Nml N/A  Left Biceps Nml Nml Nml Nml Nml Nml Nml Nml N/A  Left Triceps Nml Nml Nml Nml Nml Nml Nml Nml N/A  Left Deltoid Nml Nml Nml Nml Nml Nml Nml Nml N/A      Waveforms:

## 2023-09-16 ENCOUNTER — Encounter: Payer: Self-pay | Admitting: Orthopedic Surgery

## 2023-09-16 ENCOUNTER — Other Ambulatory Visit (INDEPENDENT_AMBULATORY_CARE_PROVIDER_SITE_OTHER): Payer: BC Managed Care – PPO

## 2023-09-16 ENCOUNTER — Ambulatory Visit: Payer: BC Managed Care – PPO | Admitting: Orthopedic Surgery

## 2023-09-16 VITALS — BP 131/87 | HR 85 | Ht 63.0 in | Wt 210.0 lb

## 2023-09-16 DIAGNOSIS — M5030 Other cervical disc degeneration, unspecified cervical region: Secondary | ICD-10-CM | POA: Diagnosis not present

## 2023-09-16 DIAGNOSIS — M542 Cervicalgia: Secondary | ICD-10-CM

## 2023-09-16 MED ORDER — GABAPENTIN 100 MG PO CAPS: 100.0000 mg | ORAL_CAPSULE | Freq: Three times a day (TID) | ORAL | 2 refills | Status: AC

## 2023-09-16 MED ORDER — TIZANIDINE HCL 4 MG PO TABS: 4.0000 mg | ORAL_TABLET | Freq: Four times a day (QID) | ORAL | 0 refills | Status: AC | PRN

## 2023-09-16 MED ORDER — PREDNISONE 10 MG PO TABS: 10.0000 mg | ORAL_TABLET | Freq: Three times a day (TID) | ORAL | 0 refills | Status: DC

## 2023-09-16 NOTE — Patient Instructions (Addendum)
Physical therapy has been ordered for you at [facility]

## 2023-09-16 NOTE — Progress Notes (Signed)
Patient: Jennifer Ellis           Date of Birth: 1966-02-26           MRN: [id number] Visit Date: 09/16/2023 Requested by: Richardean Chimera, MD [address] [place],  [place] [address] PCP: Richardean Chimera, MD   Chief Complaint  Patient presents with   Shoulder Pain    Right for 6 months   Encounter Diagnoses  Name Primary?   Neck pain    DDD (degenerative disc disease), cervical Yes    Plan:  57 year old female with neck and shoulder pain with pain really in the right trapezius muscle and medial border of the scapula  The x-rays show cervical disc disease mid cervical spine C4-5 and C5-6  Recommend nonoperative treatment -Physical therapy -Anti-inflammatories -Muscle relaxer -Gabapentin for pain  Meds ordered this encounter  Medications   predniSONE (DELTASONE) 10 MG tablet    Sig: Take 1 tablet (10 mg total) by mouth 3 (three) times daily.    Dispense:  42 tablet    Refill:  0   tiZANidine (ZANAFLEX) 4 MG tablet    Sig: Take 1 tablet (4 mg total) by mouth every 6 (six) hours as needed for muscle spasms.    Dispense:  30 tablet    Refill:  0   gabapentin (NEURONTIN) 100 MG capsule    Sig: Take 1 capsule (100 mg total) by mouth 3 (three) times daily.    Dispense:  90 capsule    Refill:  2   Follow-up 6 weeks if no improvement consider MRI  Chief Complaint  Patient presents with   Shoulder Pain    Right for 6 months    57 year old female 78-month history of right shoulder and neck pain  She calls that shoulder pain but points to the medial border of the scapula and right trapezius muscle and denies pain in the shoulder joint area  No treatment to date  System review does not seem to have radicular symptoms or   Body mass index is 37.2 kg/m.   Problem list, medical hx, medications and allergies reviewed   ROS -As noted   Allergies  Allergen Reactions   Lisinopril Rash    "It gave me a rash".     BP 131/87 Comment: on patients watch DR Romeo Apple said  more accurate use this  Pulse 85   Ht 5\' 3"  (1.6 m)   Wt 210 lb (95.3 kg)   BMI 37.20 kg/m    Physical exam: Physical Exam Vitals and nursing note reviewed.  Constitutional:      Appearance: Normal appearance.  HENT:     Head: Normocephalic and atraumatic.  Eyes:     General: No scleral icterus.       Right eye: No discharge.        Left eye: No discharge.     Extraocular Movements: Extraocular movements intact.     Conjunctiva/sclera: Conjunctivae normal.     Pupils: Pupils are equal, round, and reactive to light.  Cardiovascular:     Rate and Rhythm: Normal rate.     Pulses: Normal pulses.  Skin:    General: Skin is warm and dry.     Capillary Refill: Capillary refill takes less than 2 seconds.  Neurological:     General: No focal deficit present.     Mental Status: She is alert and oriented to person, place, and time.  Psychiatric:        Mood and Affect:  Mood normal.        Behavior: Behavior normal.        Thought Content: Thought content normal.        Judgment: Judgment normal.    Back Exam   Tenderness  The patient is experiencing tenderness in the cervical (Right trapezius muscle medial border of scapula cervical spine midline nontender).  Range of Motion  The patient has normal back ROM.     Data reviewed:   Image(s) reviewed with personal interpretation:  Internal C-spine images show degenerative disc disease in the C4-5 and C5-6 level  Assessment and plan:  Encounter Diagnoses  Name Primary?   Neck pain    DDD (degenerative disc disease), cervical Yes     Meds ordered this encounter  Medications   predniSONE (DELTASONE) 10 MG tablet    Sig: Take 1 tablet (10 mg total) by mouth 3 (three) times daily.    Dispense:  42 tablet    Refill:  0   tiZANidine (ZANAFLEX) 4 MG tablet    Sig: Take 1 tablet (4 mg total) by mouth every 6 (six) hours as needed for muscle spasms.    Dispense:  30 tablet    Refill:  0   gabapentin (NEURONTIN) 100 MG  capsule    Sig: Take 1 capsule (100 mg total) by mouth 3 (three) times daily.    Dispense:  90 capsule    Refill:  2    Procedures:   None today

## 2023-10-28 ENCOUNTER — Ambulatory Visit: Payer: BC Managed Care – PPO | Admitting: Orthopedic Surgery

## 2023-11-15 ENCOUNTER — Ambulatory Visit (HOSPITAL_COMMUNITY): Payer: Self-pay | Admitting: Orthopedic Surgery

## 2023-12-12 ENCOUNTER — Encounter (HOSPITAL_COMMUNITY): Payer: Self-pay

## 2023-12-12 NOTE — Progress Notes (Signed)
Surgical Instructions   Your procedure is scheduled on December 30, 24. Report to [facility] Main Entrance "A" at 5:30 A.M., then check in with the Admitting office. Any questions or running late day of surgery: call [phone]  Questions prior to your surgery date: call [phone], Monday-Friday, 8am-4pm. If you experience any cold or flu symptoms such as cough, fever, chills, shortness of breath, etc. between now and your scheduled surgery, please notify us at the above number.     Remember:  Do not eat after midnight the night before your surgery   You may drink clear liquids until 4:30 the morning of your surgery.   Clear liquids allowed are: Water, Non-Citrus Juices (without pulp), Carbonated Beverages, Clear Tea (no milk, honey, etc.), Black Coffee Only (NO MILK, CREAM OR POWDERED CREAMER of any kind), and Gatorade.    May take these medicines IF NEEDED:  gabapentin (NEURONTIN)     One week prior to surgery, STOP taking any Aspirin (unless otherwise instructed by your surgeon) Aleve, Naproxen, Ibuprofen, Motrin, Advil, Goody's, BC's, all herbal medications, fish oil, and non-prescription vitamins.                     Do NOT Smoke (Tobacco/Vaping) for 24 hours prior to your procedure.  If you use a CPAP at night, you may bring your mask/headgear for your overnight stay.   You will be asked to remove any contacts, glasses, piercing's, hearing aid's, dentures/partials prior to surgery. Please bring cases for these items if needed.    Patients discharged the day of surgery will not be allowed to drive home, and someone needs to stay with them for 24 hours.  SURGICAL WAITING ROOM VISITATION Patients may have no more than 2 support people in the waiting area - these visitors may rotate.   Pre-op nurse will coordinate an appropriate time for 1 ADULT support person, who may not rotate, to accompany patient in pre-op.  Children under the age of 22 must have an adult with them  who is not the patient and must remain in the main waiting area with an adult.  If the patient needs to stay at the hospital during part of their recovery, the visitor guidelines for inpatient rooms apply.  Please refer to the [facility] website for the visitor guidelines for any additional information.   If you received a COVID test during your pre-op visit  it is requested that you wear a mask when out in public, stay away from anyone that may not be feeling well and notify your surgeon if you develop symptoms. If you have been in contact with anyone that has tested positive in the last 10 days please notify you surgeon.      Pre-operative CHG Bathing Instructions   You can play a key role in reducing the risk of infection after surgery. Your skin needs to be as free of germs as possible. You can reduce the number of germs on your skin by washing with CHG (chlorhexidine gluconate) soap before surgery. CHG is an antiseptic soap that kills germs and continues to kill germs even after washing.   DO NOT use if you have an allergy to chlorhexidine/CHG or antibacterial soaps. If your skin becomes reddened or irritated, stop using the CHG and notify one of our RNs at [phone].              TAKE A SHOWER THE NIGHT BEFORE SURGERY AND THE DAY OF SURGERY  Please keep in mind the following:  DO NOT shave, including legs and underarms, 48 hours prior to surgery.   You may shave your face before/day of surgery.  Place clean sheets on your bed the night before surgery Use a clean washcloth (not used since being washed) for each shower. DO NOT sleep with pet's night before surgery.  CHG Shower Instructions:  Wash your face and private area with normal soap. If you choose to wash your hair, wash first with your normal shampoo.  After you use shampoo/soap, rinse your hair and body thoroughly to remove shampoo/soap residue.  Turn the water OFF and apply half the bottle of CHG soap to a CLEAN  washcloth.  Apply CHG soap ONLY FROM YOUR NECK DOWN TO YOUR TOES (washing for 3-5 minutes)  DO NOT use CHG soap on face, private areas, open wounds, or sores.  Pay special attention to the area where your surgery is being performed.  If you are having back surgery, having someone wash your back for you may be helpful. Wait 2 minutes after CHG soap is applied, then you may rinse off the CHG soap.  Pat dry with a clean towel  Put on clean pajamas    Additional instructions for the day of surgery: DO NOT APPLY any lotions, deodorants, cologne, or perfumes.   Do not wear jewelry or makeup Do not wear nail polish, gel polish, artificial nails, or any other type of covering on natural nails (fingers and toes) Do not bring valuables to the hospital. [facility] is not responsible for valuables/personal belongings. Put on clean/comfortable clothes.  Please brush your teeth.  Ask your nurse before applying any prescription medications to the skin.

## 2023-12-13 ENCOUNTER — Encounter (HOSPITAL_COMMUNITY)
Admission: RE | Admit: 2023-12-13 | Discharge: 2023-12-13 | Disposition: A | Discharge status: Home / Self Care | Payer: BC Managed Care – PPO | Source: Ambulatory Visit | Attending: Orthopedic Surgery | Admitting: Orthopedic Surgery

## 2023-12-13 ENCOUNTER — Encounter (HOSPITAL_COMMUNITY): Payer: Self-pay

## 2023-12-13 ENCOUNTER — Other Ambulatory Visit: Payer: Self-pay

## 2023-12-13 VITALS — BP 138/91 | HR 99 | Temp 98.1°F | Resp 17 | Ht 63.0 in | Wt 215.9 lb

## 2023-12-13 DIAGNOSIS — Z01818 Encounter for other preprocedural examination: Secondary | ICD-10-CM | POA: Diagnosis present

## 2023-12-13 HISTORY — DX: Essential (primary) hypertension: I10

## 2023-12-13 LAB — SURGICAL PCR SCREEN
MRSA, PCR: NEGATIVE
Staphylococcus aureus: NEGATIVE

## 2023-12-13 LAB — BASIC METABOLIC PANEL
Anion gap: 8 (ref 5–15)
BUN: 9 mg/dL (ref 6–20)
CO2: 26 mmol/L (ref 22–32)
Calcium: 9.2 mg/dL (ref 8.9–10.3)
Chloride: 105 mmol/L (ref 98–111)
Creatinine, Ser: 0.76 mg/dL (ref 0.44–1.00)
GFR, Estimated: >60 mL/min (ref >60)
Glucose, Bld: 121 mg/dL — ABNORMAL HIGH (ref 70–99)
Potassium: 3 mmol/L — ABNORMAL LOW (ref 3.5–5.1)
Sodium: 139 mmol/L (ref 135–145)

## 2023-12-13 LAB — CBC
HCT: 40.3 % (ref 36.0–46.0)
Hemoglobin: 13.6 g/dL (ref 12.0–15.0)
MCH: 30.8 pg (ref 26.0–34.0)
MCHC: 33.7 g/dL (ref 30.0–36.0)
MCV: 91.2 fL (ref 80.0–100.0)
Platelets: 281 10*3/uL (ref 150–400)
RBC: 4.42 MIL/uL (ref 3.87–5.11)
RDW: 12.7 % (ref 11.5–15.5)
WBC: 6.3 10*3/uL (ref 4.0–10.5)
nRBC: 0 % (ref 0.0–0.2)

## 2023-12-13 NOTE — Progress Notes (Signed)
PCP - Dr. Donzetta Sprung Cardiologist - denies  PPM/ICD - denies Device Orders - denies Rep Notified - denies  Chest x-ray - n/a EKG - 03-14-23 Stress Test - denies ECHO - denies Cardiac Cath - denies  Sleep Study - denies CPAP - n/a  Fasting Blood Sugar - denies Checks Blood Sugar _____ times a day n/a  Last dose of GLP1 agonist-  denies GLP1 instructions: n/a  Blood Thinner Instructions: denies any Aspirin Instructions:denies taking any  ERAS Protcol - eras  PRE-SURGERY Ensure or G2- no drink  COVID TEST- N   Anesthesia review: N  Patient denies shortness of breath, fever, cough and chest pain at PAT appointment. Patient denies any respiratory illnesses   All instructions explained to the patient, with a verbal understanding of the material. Patient agrees to go over the instructions while at home for a better understanding. Patient also instructed to self quarantine after being tested for COVID-19. The opportunity to ask questions was provided.

## 2023-12-23 ENCOUNTER — Encounter (HOSPITAL_COMMUNITY): Payer: Self-pay | Admitting: Orthopedic Surgery

## 2023-12-23 ENCOUNTER — Ambulatory Visit (HOSPITAL_COMMUNITY): Payer: BC Managed Care – PPO | Admitting: Anesthesiology

## 2023-12-23 ENCOUNTER — Encounter (HOSPITAL_COMMUNITY)
Admission: RE | Disposition: A | Discharge status: Home / Self Care | Payer: Self-pay | Source: Home / Self Care | Attending: Orthopedic Surgery

## 2023-12-23 ENCOUNTER — Ambulatory Visit (HOSPITAL_COMMUNITY): Payer: BC Managed Care – PPO

## 2023-12-23 ENCOUNTER — Other Ambulatory Visit: Payer: Self-pay

## 2023-12-23 ENCOUNTER — Observation Stay (HOSPITAL_COMMUNITY)
Admission: RE | Admit: 2023-12-23 | Discharge: 2023-12-24 | Disposition: A | Discharge status: Home / Self Care | Payer: BC Managed Care – PPO | Attending: Orthopedic Surgery | Admitting: Orthopedic Surgery

## 2023-12-23 DIAGNOSIS — Z79899 Other long term (current) drug therapy: Secondary | ICD-10-CM | POA: Diagnosis not present

## 2023-12-23 DIAGNOSIS — M4712 Other spondylosis with myelopathy, cervical region: Secondary | ICD-10-CM | POA: Diagnosis present

## 2023-12-23 DIAGNOSIS — I10 Essential (primary) hypertension: Secondary | ICD-10-CM | POA: Diagnosis not present

## 2023-12-23 DIAGNOSIS — G959 Disease of spinal cord, unspecified: Principal | ICD-10-CM | POA: Diagnosis present

## 2023-12-23 HISTORY — PX: ANTERIOR CERVICAL DECOMPRESSION/DISCECTOMY FUSION 4 LEVELS: SHX5556

## 2023-12-23 LAB — TYPE AND SCREEN
ABO/RH(D): O POS
Antibody Screen: NEGATIVE

## 2023-12-23 LAB — ABO/RH: ABO/RH(D): O POS

## 2023-12-23 SURGERY — ANTERIOR CERVICAL DECOMPRESSION/DISCECTOMY FUSION 4 LEVELS
Anesthesia: General

## 2023-12-23 MED ORDER — HYDRALAZINE HCL 20 MG/ML IJ SOLN
INTRAMUSCULAR | Status: AC
  Filled 2023-12-23: qty 1

## 2023-12-23 MED ORDER — HYDRALAZINE HCL 20 MG/ML IJ SOLN
10.0000 mg | Freq: Once | INTRAMUSCULAR | Status: AC
  Administered 2023-12-24: 10 mg via INTRAVENOUS
  Filled 2023-12-23: qty 0.5

## 2023-12-23 MED ORDER — PHENYLEPHRINE HCL-NACL 20-0.9 MG/250ML-% IV SOLN
INTRAVENOUS | Status: DC | PRN
  Administered 2023-12-23: 30 ug/min via INTRAVENOUS

## 2023-12-23 MED ORDER — ACETAMINOPHEN 325 MG PO TABS
650.0000 mg | ORAL_TABLET | ORAL | Status: DC | PRN
  Administered 2023-12-23 – 2023-12-24 (×2): 650 mg via ORAL
  Filled 2023-12-23 (×3): qty 2

## 2023-12-23 MED ORDER — POLYETHYLENE GLYCOL 3350 17 G PO PACK: 17.0000 g | PACK | Freq: Every day | ORAL | Status: DC | PRN

## 2023-12-23 MED ORDER — OLMESARTAN MEDOXOMIL-HCTZ 20-12.5 MG PO TABS: 0.5000 | ORAL_TABLET | Freq: Every day | ORAL | Status: DC

## 2023-12-23 MED ORDER — THROMBIN 20000 UNITS EX SOLR
CUTANEOUS | Status: DC | PRN
  Administered 2023-12-23: 5 mL via TOPICAL

## 2023-12-23 MED ORDER — MIDAZOLAM HCL 2 MG/2ML IJ SOLN
INTRAMUSCULAR | Status: AC
  Filled 2023-12-23: qty 2

## 2023-12-23 MED ORDER — GABAPENTIN 300 MG PO CAPS
300.0000 mg | ORAL_CAPSULE | Freq: Three times a day (TID) | ORAL | Status: DC
  Administered 2023-12-23 – 2023-12-24 (×3): 300 mg via ORAL
  Filled 2023-12-23 (×3): qty 1

## 2023-12-23 MED ORDER — METHOCARBAMOL 1000 MG/10ML IJ SOLN: 500.0000 mg | Freq: Four times a day (QID) | INTRAMUSCULAR | Status: DC | PRN

## 2023-12-23 MED ORDER — PHENOL 1.4 % MT LIQD: 1.0000 | OROMUCOSAL | Status: DC | PRN

## 2023-12-23 MED ORDER — SODIUM CHLORIDE 0.9 % IV SOLN
0.1500 ug/kg/min | INTRAVENOUS | Status: DC
  Administered 2023-12-23: .05 ug/kg/min via INTRAVENOUS
  Filled 2023-12-23: qty 2000

## 2023-12-23 MED ORDER — METHOCARBAMOL 500 MG PO TABS: 500.0000 mg | ORAL_TABLET | Freq: Three times a day (TID) | ORAL | 0 refills | Status: AC | PRN

## 2023-12-23 MED ORDER — LABETALOL HCL 5 MG/ML IV SOLN
INTRAVENOUS | Status: AC
  Filled 2023-12-23: qty 4

## 2023-12-23 MED ORDER — 0.9 % SODIUM CHLORIDE (POUR BTL) OPTIME
TOPICAL | Status: DC | PRN
  Administered 2023-12-23: 1000 mL

## 2023-12-23 MED ORDER — ACETAMINOPHEN 650 MG RE SUPP: 650.0000 mg | RECTAL | Status: DC | PRN

## 2023-12-23 MED ORDER — PROPOFOL 1000 MG/100ML IV EMUL
INTRAVENOUS | Status: AC
  Filled 2023-12-23: qty 100

## 2023-12-23 MED ORDER — LABETALOL HCL 5 MG/ML IV SOLN
5.0000 mg | Freq: Once | INTRAVENOUS | Status: AC
  Administered 2023-12-23: 5 mg via INTRAVENOUS

## 2023-12-23 MED ORDER — SENNOSIDES-DOCUSATE SODIUM 8.6-50 MG PO TABS
1.0000 | ORAL_TABLET | Freq: Once | ORAL | Status: AC
  Administered 2023-12-23: 1 via ORAL
  Filled 2023-12-23: qty 1

## 2023-12-23 MED ORDER — LACTATED RINGERS IV SOLN
INTRAVENOUS | Status: DC
Start: 2023-12-23 — End: 2023-12-24

## 2023-12-23 MED ORDER — OXYCODONE HCL 5 MG PO TABS: 5.0000 mg | ORAL_TABLET | Freq: Once | ORAL | Status: DC | PRN

## 2023-12-23 MED ORDER — FENTANYL CITRATE (PF) 100 MCG/2ML IJ SOLN
25.0000 ug | INTRAMUSCULAR | Status: DC | PRN
Start: 2023-12-23 — End: 2023-12-23
  Administered 2023-12-23: 25 ug via INTRAVENOUS
  Administered 2023-12-23: 50 ug via INTRAVENOUS
  Administered 2023-12-23: 25 ug via INTRAVENOUS
  Administered 2023-12-23: 50 ug via INTRAVENOUS

## 2023-12-23 MED ORDER — ONDANSETRON HCL 4 MG PO TABS: 4.0000 mg | ORAL_TABLET | Freq: Four times a day (QID) | ORAL | Status: DC | PRN

## 2023-12-23 MED ORDER — ONDANSETRON HCL 4 MG PO TABS: 4.0000 mg | ORAL_TABLET | Freq: Three times a day (TID) | ORAL | 0 refills | Status: AC | PRN

## 2023-12-23 MED ORDER — HYDROMORPHONE HCL 1 MG/ML IJ SOLN
1.0000 mg | INTRAMUSCULAR | Status: DC | PRN
  Administered 2023-12-23: 1 mg via INTRAVENOUS
  Filled 2023-12-23: qty 1

## 2023-12-23 MED ORDER — DEXAMETHASONE 4 MG PO TABS
4.0000 mg | ORAL_TABLET | Freq: Four times a day (QID) | ORAL | Status: DC
  Administered 2023-12-24: 4 mg via ORAL
  Filled 2023-12-23: qty 1

## 2023-12-23 MED ORDER — SODIUM CHLORIDE 0.9% FLUSH: 3.0000 mL | INTRAVENOUS | Status: DC | PRN

## 2023-12-23 MED ORDER — ACETAMINOPHEN 10 MG/ML IV SOLN: 1000.0000 mg | Freq: Once | INTRAVENOUS | Status: DC | PRN

## 2023-12-23 MED ORDER — MIDAZOLAM HCL 5 MG/5ML IJ SOLN
INTRAMUSCULAR | Status: DC | PRN
  Administered 2023-12-23: 2 mg via INTRAVENOUS

## 2023-12-23 MED ORDER — PHENYLEPHRINE HCL-NACL 20-0.9 MG/250ML-% IV SOLN
INTRAVENOUS | Status: AC
  Filled 2023-12-23: qty 250

## 2023-12-23 MED ORDER — GLYCOPYRROLATE PF 0.2 MG/ML IJ SOSY
PREFILLED_SYRINGE | INTRAMUSCULAR | Status: DC | PRN
  Administered 2023-12-23: .2 mg via INTRAVENOUS

## 2023-12-23 MED ORDER — SODIUM CHLORIDE 0.9 % IV SOLN
250.0000 mL | INTRAVENOUS | Status: DC
  Administered 2023-12-23: 250 mL via INTRAVENOUS

## 2023-12-23 MED ORDER — CEFAZOLIN SODIUM-DEXTROSE 2-4 GM/100ML-% IV SOLN
2.0000 g | INTRAVENOUS | Status: DC
  Filled 2023-12-23: qty 100

## 2023-12-23 MED ORDER — IRBESARTAN 75 MG PO TABS
75.0000 mg | ORAL_TABLET | Freq: Every day | ORAL | Status: DC
  Administered 2023-12-23: 75 mg via ORAL
  Filled 2023-12-23: qty 1

## 2023-12-23 MED ORDER — TRANEXAMIC ACID-NACL 1000-0.7 MG/100ML-% IV SOLN
1000.0000 mg | INTRAVENOUS | Status: AC
  Administered 2023-12-23: 1000 mg via INTRAVENOUS
  Filled 2023-12-23: qty 100

## 2023-12-23 MED ORDER — PROPOFOL 10 MG/ML IV BOLUS
INTRAVENOUS | Status: AC
  Filled 2023-12-23: qty 20

## 2023-12-23 MED ORDER — PROPOFOL 1000 MG/100ML IV EMUL
INTRAVENOUS | Status: AC
  Filled 2023-12-23: qty 200

## 2023-12-23 MED ORDER — LACTATED RINGERS IV SOLN: INTRAVENOUS | Status: DC | PRN

## 2023-12-23 MED ORDER — OXYCODONE-ACETAMINOPHEN 10-325 MG PO TABS: 1.0000 | ORAL_TABLET | Freq: Four times a day (QID) | ORAL | 0 refills | Status: AC | PRN

## 2023-12-23 MED ORDER — BUPIVACAINE-EPINEPHRINE (PF) 0.25% -1:200000 IJ SOLN
INTRAMUSCULAR | Status: AC
  Filled 2023-12-23: qty 30

## 2023-12-23 MED ORDER — ORAL CARE MOUTH RINSE: 15.0000 mL | Freq: Once | OROMUCOSAL | Status: AC

## 2023-12-23 MED ORDER — PROPOFOL 500 MG/50ML IV EMUL
INTRAVENOUS | Status: DC | PRN
  Administered 2023-12-23: 150 ug/kg/min via INTRAVENOUS

## 2023-12-23 MED ORDER — SUCCINYLCHOLINE CHLORIDE 200 MG/10ML IV SOSY
PREFILLED_SYRINGE | INTRAVENOUS | Status: DC | PRN
  Administered 2023-12-23: 160 mg via INTRAVENOUS

## 2023-12-23 MED ORDER — BUPIVACAINE-EPINEPHRINE 0.25% -1:200000 IJ SOLN
INTRAMUSCULAR | Status: DC | PRN
  Administered 2023-12-23: 10 mL

## 2023-12-23 MED ORDER — MENTHOL 3 MG MT LOZG: 1.0000 | LOZENGE | OROMUCOSAL | Status: DC | PRN

## 2023-12-23 MED ORDER — CEFAZOLIN SODIUM-DEXTROSE 2-3 GM-%(50ML) IV SOLR
INTRAVENOUS | Status: DC | PRN
  Administered 2023-12-23: 2 g via INTRAVENOUS

## 2023-12-23 MED ORDER — FENTANYL CITRATE (PF) 100 MCG/2ML IJ SOLN
INTRAMUSCULAR | Status: AC
  Filled 2023-12-23: qty 2

## 2023-12-23 MED ORDER — DOCUSATE SODIUM 100 MG PO CAPS
100.0000 mg | ORAL_CAPSULE | Freq: Every day | ORAL | Status: DC
  Administered 2023-12-23: 100 mg via ORAL
  Filled 2023-12-23 (×2): qty 1

## 2023-12-23 MED ORDER — ONDANSETRON HCL 4 MG/2ML IJ SOLN
4.0000 mg | Freq: Four times a day (QID) | INTRAMUSCULAR | Status: DC | PRN
  Administered 2023-12-23: 4 mg via INTRAVENOUS
  Filled 2023-12-23: qty 2

## 2023-12-23 MED ORDER — CHLORHEXIDINE GLUCONATE 0.12 % MT SOLN
15.0000 mL | Freq: Once | OROMUCOSAL | Status: AC
  Administered 2023-12-23: 15 mL via OROMUCOSAL
  Filled 2023-12-23: qty 15

## 2023-12-23 MED ORDER — CEFAZOLIN SODIUM-DEXTROSE 1-4 GM/50ML-% IV SOLN
1.0000 g | Freq: Three times a day (TID) | INTRAVENOUS | Status: AC
  Administered 2023-12-23 – 2023-12-24 (×2): 1 g via INTRAVENOUS
  Filled 2023-12-23 (×2): qty 50

## 2023-12-23 MED ORDER — LABETALOL HCL 5 MG/ML IV SOLN
10.0000 mg | Freq: Once | INTRAVENOUS | Status: AC
  Administered 2023-12-23: 10 mg via INTRAVENOUS

## 2023-12-23 MED ORDER — ONDANSETRON HCL 4 MG/2ML IJ SOLN
4.0000 mg | Freq: Once | INTRAMUSCULAR | Status: AC | PRN
Start: 2023-12-23 — End: 2023-12-23
  Administered 2023-12-23: 4 mg via INTRAVENOUS

## 2023-12-23 MED ORDER — HYDRALAZINE HCL 20 MG/ML IJ SOLN
10.0000 mg | Freq: Once | INTRAMUSCULAR | Status: AC
  Administered 2023-12-23: 10 mg via INTRAVENOUS

## 2023-12-23 MED ORDER — FLEET ENEMA RE ENEM: 1.0000 | ENEMA | Freq: Once | RECTAL | Status: DC | PRN

## 2023-12-23 MED ORDER — OXYCODONE HCL 5 MG PO TABS: 10.0000 mg | ORAL_TABLET | ORAL | Status: DC | PRN

## 2023-12-23 MED ORDER — THROMBIN 20000 UNITS EX SOLR
CUTANEOUS | Status: AC
  Filled 2023-12-23: qty 20000

## 2023-12-23 MED ORDER — ONDANSETRON HCL 4 MG/2ML IJ SOLN
INTRAMUSCULAR | Status: DC | PRN
  Administered 2023-12-23: 4 mg via INTRAVENOUS

## 2023-12-23 MED ORDER — OXYCODONE HCL 5 MG PO TABS
5.0000 mg | ORAL_TABLET | ORAL | Status: DC | PRN
  Filled 2023-12-23: qty 1

## 2023-12-23 MED ORDER — FENTANYL CITRATE (PF) 250 MCG/5ML IJ SOLN
INTRAMUSCULAR | Status: AC
  Filled 2023-12-23: qty 5

## 2023-12-23 MED ORDER — DEXAMETHASONE SODIUM PHOSPHATE 4 MG/ML IJ SOLN
4.0000 mg | Freq: Four times a day (QID) | INTRAMUSCULAR | Status: DC
  Administered 2023-12-23 – 2023-12-24 (×3): 4 mg via INTRAVENOUS
  Filled 2023-12-23 (×3): qty 1

## 2023-12-23 MED ORDER — OXYCODONE HCL 5 MG/5ML PO SOLN: 5.0000 mg | Freq: Once | ORAL | Status: DC | PRN

## 2023-12-23 MED ORDER — FENTANYL CITRATE (PF) 250 MCG/5ML IJ SOLN
INTRAMUSCULAR | Status: DC | PRN
  Administered 2023-12-23: 50 ug via INTRAVENOUS
  Administered 2023-12-23: 100 ug via INTRAVENOUS
  Administered 2023-12-23: 50 ug via INTRAVENOUS
  Administered 2023-12-23: 25 ug via INTRAVENOUS

## 2023-12-23 MED ORDER — SODIUM CHLORIDE 0.9% FLUSH
3.0000 mL | Freq: Two times a day (BID) | INTRAVENOUS | Status: DC
  Administered 2023-12-23: 3 mL via INTRAVENOUS

## 2023-12-23 MED ORDER — ONDANSETRON HCL 4 MG/2ML IJ SOLN
INTRAMUSCULAR | Status: AC
  Filled 2023-12-23: qty 2

## 2023-12-23 MED ORDER — PROPOFOL 10 MG/ML IV BOLUS
INTRAVENOUS | Status: DC | PRN
  Administered 2023-12-23: 160 mg via INTRAVENOUS

## 2023-12-23 MED ORDER — HYDROCHLOROTHIAZIDE 12.5 MG PO TABS
6.2500 mg | ORAL_TABLET | Freq: Every day | ORAL | Status: DC
  Administered 2023-12-23: 6.25 mg via ORAL
  Filled 2023-12-23: qty 1

## 2023-12-23 MED ORDER — SODIUM CHLORIDE 0.9% FLUSH: 3.0000 mL | Freq: Two times a day (BID) | INTRAVENOUS | Status: DC

## 2023-12-23 MED ORDER — METHOCARBAMOL 500 MG PO TABS
500.0000 mg | ORAL_TABLET | Freq: Four times a day (QID) | ORAL | Status: DC | PRN
  Administered 2023-12-23 – 2023-12-24 (×2): 500 mg via ORAL
  Filled 2023-12-23 (×2): qty 1

## 2023-12-23 MED ORDER — DEXAMETHASONE SODIUM PHOSPHATE 10 MG/ML IJ SOLN
INTRAMUSCULAR | Status: DC | PRN
  Administered 2023-12-23: 10 mg via INTRAVENOUS

## 2023-12-23 SURGICAL SUPPLY — 68 items
BAG COUNTER SPONGE SURGICOUNT (BAG) ×1 IMPLANT
BAND RUBBER #18 3X1/16 STRL (MISCELLANEOUS) IMPLANT
BLADE CLIPPER SURG (BLADE) IMPLANT
CABLE BIPOLOR RESECTION CORD (MISCELLANEOUS) ×1 IMPLANT
CAGE REVEL-S 15X18 7 D 6-9MM (Cage) ×2 IMPLANT
CAGE REVEL-S 15X18 7D 6-9 (Cage) IMPLANT
CANISTER SUCT 3000ML PPV (MISCELLANEOUS) ×1 IMPLANT
CLSR STERI-STRIP ANTIMIC 1/2X4 (GAUZE/BANDAGES/DRESSINGS) ×1 IMPLANT
COLLAR CERV LO CONTOUR FIRM DE (SOFTGOODS) IMPLANT
COVER MAYO STAND STRL (DRAPES) ×2 IMPLANT
COVER SURGICAL LIGHT HANDLE (MISCELLANEOUS) ×2 IMPLANT
DRAIN CHANNEL 15F RND FF W/TCR (WOUND CARE) IMPLANT
DRAPE C-ARM 42X72 X-RAY (DRAPES) ×1 IMPLANT
DRAPE POUCH INSTRU U-SHP 10X18 (DRAPES) IMPLANT
DRAPE SURG 17X23 STRL (DRAPES) ×1 IMPLANT
DRAPE U-SHAPE 47X51 STRL (DRAPES) ×1 IMPLANT
DRSG OPSITE POSTOP 4X6 (GAUZE/BANDAGES/DRESSINGS) ×1 IMPLANT
DRSG OPSITE POSTOP 4X8 (GAUZE/BANDAGES/DRESSINGS) IMPLANT
DURAPREP 26ML APPLICATOR (WOUND CARE) ×1 IMPLANT
ELECT COATED BLADE 2.86 ST (ELECTRODE) ×1 IMPLANT
ELECT PENCIL ROCKER SW 15FT (MISCELLANEOUS) ×1 IMPLANT
ELECT REM PT RETURN 9FT ADLT (ELECTROSURGICAL) ×1
ELECTRODE REM PT RTRN 9FT ADLT (ELECTROSURGICAL) ×1 IMPLANT
FEE INTRAOP CADWELL SUPPLY NCS (MISCELLANEOUS) IMPLANT
FEE INTRAOP MONITOR IMPULS NCS (MISCELLANEOUS) IMPLANT
GLOVE BIO SURGEON STRL SZ 6.5 (GLOVE) ×1 IMPLANT
GLOVE BIOGEL PI IND STRL 6.5 (GLOVE) ×1 IMPLANT
GLOVE BIOGEL PI IND STRL 8.5 (GLOVE) ×1 IMPLANT
GLOVE SS BIOGEL STRL SZ 8.5 (GLOVE) ×1 IMPLANT
GOWN STRL REUS W/TWL 2XL LVL3 (GOWN DISPOSABLE) ×1 IMPLANT
HANDLE YANKAUER SUCT OPEN TIP (MISCELLANEOUS) IMPLANT
INTRAOP CADWELL SUPPLY FEE NCS (MISCELLANEOUS) ×1
INTRAOP MONITOR FEE IMPULS NCS (MISCELLANEOUS) ×1
KIT BASIN OR (CUSTOM PROCEDURE TRAY) ×1 IMPLANT
KIT TURNOVER KIT B (KITS) ×1 IMPLANT
NDL SPNL 18GX3.5 QUINCKE PK (NEEDLE) ×1 IMPLANT
NEEDLE SPNL 18GX3.5 QUINCKE PK (NEEDLE) ×1 IMPLANT
NS IRRIG 1000ML POUR BTL (IV SOLUTION) ×1 IMPLANT
PACK ORTHO CERVICAL (CUSTOM PROCEDURE TRAY) ×1 IMPLANT
PACK UNIVERSAL I (CUSTOM PROCEDURE TRAY) ×1 IMPLANT
PAD ARMBOARD 7.5X6 YLW CONV (MISCELLANEOUS) ×2 IMPLANT
PATTIES SURGICAL .5 X.5 (GAUZE/BANDAGES/DRESSINGS) IMPLANT
PIN DISTRATION 14MM (PIN) IMPLANT
POSITIONER HEAD DONUT 9IN (MISCELLANEOUS) ×1 IMPLANT
PUTTY BONE DBX 5CC MIX (Putty) IMPLANT
PUTTY DBX 2.5CC (Putty) ×1 IMPLANT
PUTTY DBX 2.5CC DEPUY (Putty) IMPLANT
REVEL-S 14 X 16MM 7 DEGREE (Cage) ×2 IMPLANT
SCREW SELF DRILL 3.6MM14MM (Screw) IMPLANT
SCREW SELF DRILL 4.2MM14MM (Screw) IMPLANT
SPACER REVEL-S 14X16 7D (Cage) IMPLANT
SPONGE INTESTINAL PEANUT (DISPOSABLE) ×1 IMPLANT
SPONGE SURGIFOAM ABS GEL 100 (HEMOSTASIS) ×1 IMPLANT
SPONGE T-LAP 4X18 ~~LOC~~+RFID (SPONGE) ×2 IMPLANT
SURGIFLO W/THROMBIN 8M KIT (HEMOSTASIS) ×1 IMPLANT
SUT BONE WAX W31G (SUTURE) ×1 IMPLANT
SUT MNCRL AB 3-0 PS2 27 (SUTURE) ×1 IMPLANT
SUT SILK 2 0 TIES 10X30 (SUTURE) IMPLANT
SUT VIC AB 2-0 CT1 18 (SUTURE) ×1 IMPLANT
SUT VIC AB 3-0 54X BRD REEL (SUTURE) ×1 IMPLANT
SYR BULB IRRIG 60ML STRL (SYRINGE) ×1 IMPLANT
SYR CONTROL 10ML LL (SYRINGE) ×1 IMPLANT
TAPE CLOTH 4X10 WHT NS (GAUZE/BANDAGES/DRESSINGS) ×1 IMPLANT
TAPE UMBILICAL 1/8X30 (MISCELLANEOUS) ×1 IMPLANT
TOWEL GREEN STERILE (TOWEL DISPOSABLE) ×1 IMPLANT
TOWEL GREEN STERILE FF (TOWEL DISPOSABLE) ×1 IMPLANT
TRAY FOLEY MTR SLVR 16FR STAT (SET/KITS/TRAYS/PACK) IMPLANT
WATER STERILE IRR 1000ML POUR (IV SOLUTION) ×1 IMPLANT

## 2023-12-23 NOTE — Anesthesia Preprocedure Evaluation (Addendum)
Anesthesia Evaluation  Patient identified by MRN, date of birth, ID band Patient awake    Reviewed: Allergy & Precautions, NPO status , Patient's Chart, lab work & pertinent test results, reviewed documented beta blocker date and time   History of Anesthesia Complications Negative for: history of anesthetic complications  Airway Mallampati: III  TM Distance: >3 FB Neck ROM: Limited    Dental no notable dental hx.    Pulmonary neg COPD, neg PE   breath sounds clear to auscultation       Cardiovascular METS: 3 - Mets hypertension, (-) CAD, (-) Past MI, (-) Cardiac Stents and (-) CHF (-) dysrhythmias (-) pacemaker Rhythm:Regular Rate:Normal     Neuro/Psych neg Seizures    GI/Hepatic ,neg GERD  ,,(+) neg Cirrhosis        Endo/Other  neg diabetes    Renal/GU Renal disease     Musculoskeletal  (+) Arthritis ,    Abdominal   Peds  Hematology   Anesthesia Other Findings   Reproductive/Obstetrics                              Anesthesia Physical Anesthesia Plan  ASA: 2  Anesthesia Plan: General   Post-op Pain Management:    Induction:   PONV Risk Score and Plan: 2 and Ondansetron  Airway Management Planned: Oral ETT and Video Laryngoscope Planned  Additional Equipment: Arterial line  Intra-op Plan:   Post-operative Plan: Extubation in OR  Informed Consent: I have reviewed the patients History and Physical, chart, labs and discussed the procedure including the risks, benefits and alternatives for the proposed anesthesia with the patient or authorized representative who has indicated his/her understanding and acceptance.     Dental advisory given  Plan Discussed with: CRNA  Anesthesia Plan Comments:        Anesthesia Quick Evaluation

## 2023-12-23 NOTE — Transfer of Care (Signed)
Immediate Anesthesia Transfer of Care Note  Patient: Jennifer Ellis  Procedure(s) Performed: ANTERIOR CERVICAL DISCECTOMY AND FUSION THREE TO SEVEN  Patient Location: PACU  Anesthesia Type:General  Level of Consciousness: drowsy  Airway & Oxygen Therapy: Patient Spontanous Breathing  Post-op Assessment: Report given to RN  Post vital signs: stable  Last Vitals:  Vitals Value Taken Time  BP 135/89 12/23/23 1353  Temp 37.2 C 12/23/23 1353  Pulse 102 12/23/23 1359  Resp 21 12/23/23 1359  SpO2 95 % 12/23/23 1359  Vitals shown include unfiled device data.  Last Pain:  Vitals:   12/23/23 1353  TempSrc:   PainSc: Asleep      Patients Stated Pain Goal: 0 (12/23/23 5009)  Complications: No notable events documented.

## 2023-12-23 NOTE — Anesthesia Procedure Notes (Signed)
Procedure Name: Intubation Date/Time: 12/23/2023 7:57 AM  Performed by: Halina Andreas, CRNAPre-anesthesia Checklist: Patient identified, Emergency Drugs available, Suction available, Patient being monitored and Timeout performed Patient Re-evaluated:Patient Re-evaluated prior to induction Oxygen Delivery Method: Circle system utilized Preoxygenation: Pre-oxygenation with 100% oxygen Induction Type: IV induction Ventilation: Mask ventilation without difficulty Laryngoscope Size: McGrath and 3 Grade View: Grade I Tube type: Oral Tube size: 7.0 mm Number of attempts: 1 Airway Equipment and Method: Stylet Placement Confirmation: ETT inserted through vocal cords under direct vision, positive ETCO2 and breath sounds checked- equal and bilateral Secured at: 23 cm Tube secured with: Tape Dental Injury: Teeth and Oropharynx as per pre-operative assessment

## 2023-12-23 NOTE — Op Note (Signed)
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 C3-4: 6 to 9 mm medium 7 degree lordosis.  Cage expanded to final height of approximately 7-1/2 mm. C4-5: 6 to 9 mm medium 7 degree lordosis.  Cage expanded to final height of approximately 7 mm C5-6: 6 to 9 mm large 7 degree lordosis.  Cage expanded to final height of approximately 7 and half millimeters. C6-7: 6 to 9 mm large 7 degree lordosis.  Cage expanded to final height of approximately 7-1/2 mm.  Neuromonitoring: No adverse free running EMG, evoked motor potential, or SSEP activity throughout the case.  Graft: DBX mix  BRIEF HISTORY: Jennifer Ellis is a 57 y.o. female who presented to my office with progressive myelopathic findings.  Imaging studies confirmed multilevel degenerative cervical spondylitic disease with cord signal changes.  As a result of the severity of her disease we elected to move forward with surgery.  All appropriate risks, benefits, alternatives to surgery were discussed and consent was obtained.  PROCEDURE DETAILS: Patient was brought into the operating room and was properly positioned on the operating room table.  After induction with general anesthesia the patient was endotracheally intubated.  A timeout was taken to confirm all important data: including patient, procedure, and the level. Teds, SCD's were applied.   A Foley was placed by the nurse, and the neuromonitoring representative placed all appropriate needles and pads.  The anterior cervical spine was then prepped and draped in a standard  fashion.  Using fluoroscopy I marked out my longitudinal incision on the left side.  I infiltrated the incision with quarter percent Marcaine with epinephrine.  I then performed a standard Smith-Robinson approach to the spine using a longitudinal left-sided incision.  Incision was made and I sharply dissected down to the platysma.  I isolated the platysma and then dissected and then began my dissection through the avascular plane along the medial border of the sternocleidomastoid.  I sharply dissected down through the deep cervical and prevertebral fascia gently mobilizing the trachea and esophagus over to the right and protecting the carotid with my finger laterally.  Once I mobilized the remaining prevertebral fascia I can now visualize the anterior cervical spine from C3 down to C7.  A needle was placed into the C3-4 disc space and I took an x-ray confirming I was at the appropriate level.  The disc space was marked with the Bovie.  I now began mobilizing the longus coli muscle using proper electrocautery from the midportion of C3 to the midportion of C7 bilaterally.  The overhanging osteophytes at each of the disc space levels were removed with double-action Leksell rongeur and a pair rongeurs.  At this point I elected to begin my discectomy at C3-4.  Caspar retracting blades were placed underneath the longus coli muscle and I deflated the endotracheal cuff and expanded the retractor to the appropriate width.  Annulotomy was then performed at C3-4.  Using pituitary rongeurs I remove the bulk of the disc material.  Distraction pins were then placed into the body of C3 and C4 and I distracted the intervertebral space with a lamina spreader and maintained  with the distraction pin set.  Using curettes I remove the remaining disc material until I was visualized in the posterior annulus.  Using a fine nerve hook I began dissecting through the posterior annulus and posterior longitudinal ligament.  There was a large  central disc herniation that was mobilized and removed.  I then used a 1 mm Kerrison rongeur to resect the PLL and trim down the uncovertebral joints for further neural decompression.  At this point I could freely pass my nerve hook under the uncovertebral joints bilaterally and behind the vertebral body.  Using live fluoroscopy I confirmed parallel endplate distraction.  Once the discectomy proved satisfactory I then rasped the endplates and trialed the intervertebral spacers.  I elected to use the 6 mm medium 7 degree lordotic spacer.  It was packed with DBX mix and gently inserted to the appropriate depth.  I then removed the distraction pin from C3 and repositioned it into the body of C5.  Retractor blades were then repositioned to expose the C4-5 disc space level.  Using the same technique that I used at C3-4 I performed comprehensive discectomy at C4-5.  I made sure to remove all of the disc material as well as the cartilaginous endplate.  I remove the posterior osteophyte using a 1 mm Kerrison rongeur.  I was able to dissect through the posterior annulus and resect this with the Kerrison rongeur.  I also resected the PLL to adequately decompress the thecal sac and the nerve roots under the uncovertebral joints.  With the discectomy complete I rasped the endplates and confirmed bleeding subchondral bone.  I then trialed and placed the same spacer at this level that I used at C3-4.  I then reposition my distraction pins to the body of C6-7 and expose the disc space.  Using the same technique I performed a discectomy at C6-7.  I again took down the posterior osteophyte and the posterior annulus as well as the PLL.  Disc fragments of hard disk osteophyte were removed.  The uncovertebral joints were decompressed.  At the conclusion of the discectomy I could demonstrate parallel endplate distraction under live fluoroscopy and I could pass my nerve hook behind the vertebral body of C6 and C7 and under the  uncovertebral joints.  I then rasped the endplates and ultimately placed the large 6 to 9 mm 7 degree lordotic cage.  Using the same technique I performed a discectomy at C5-6 and placed the same size cage.  At this point in time the neuromonitoring was performed and there was no change from baseline.  Imaging studies demonstrate satisfactory positioning of all 4 intervertebral cages.  I then began expanding each of the cages sequentially to completely fill the disc space and restore the cervical lordosis.  All cages were expanded to the final height and had excellent purchase.  At C6-7 I used all to approach the vertebral bodies and placed a 14 mm length locking screw through the cage into the body of C6 and into the body of C7.  Both screws had excellent purchase.  Using the same awl technique I placed the locking screws 14 mm in length at C C3-4, C 4-5, and C5-6.  All screws had excellent purchase.  The locking mechanism was engaged according manufacture standards to prevent screw backout.  All retractors were removed and the wound was irrigated copiously with normal saline.  I made sure that hemostasis using bipolar cautery.  The trach and esophagus were returned to midline and  final x-rays were taken.  They demonstrated satisfactory positioning of all 4 intervertebral cages with restoration of cervical lordosis.  After final irrigation I then closed the platysma with interrupted 2-0 Vicryl sutures and the skin with a 3-0 Monocryl.  Steri-Strips and dry dressings were applied and the patient was ultimately extubated and transferred the PACU without incident.  The end of the case all needle sponge counts were correct.  Retractors  Venita Lick, MD 12/23/2023 1:32 PM

## 2023-12-23 NOTE — H&P (Signed)
History: Jennifer Ellis is a very pleasant 57 year old woman with cervical spondylitic myeloradiculopathy.  Patient has progressive myelopathic findings and imaging studies demonstrating multilevel cord compression.  As result we have elected to move forward with a multilevel ACDF.  The patient has expressed an understanding of the risks and benefits and the primary goal which is to prevent progression in her symptoms.  All of her and her family's questions were addressed.  Past Medical History:  Diagnosis Date   Arthritis    Hypertension     Allergies  Allergen Reactions   Lisinopril Rash    "It gave me a rash".     No current facility-administered medications on file prior to encounter.   Current Outpatient Medications on File Prior to Encounter  Medication Sig Dispense Refill   Ascorbic Acid (VITAMIN C PO) Take 1 tablet by mouth daily.     BLACK CURRANT SEED OIL PO Take 1 capsule by mouth daily.     Cholecalciferol (VITAMIN D3 PO) Take 1 tablet by mouth daily.     olmesartan-hydrochlorothiazide (BENICAR HCT) 20-12.5 MG tablet Take 0.5 tablets by mouth daily.     OVER THE COUNTER MEDICATION Take 1 capsule by mouth daily. Sea Moss     OVER THE COUNTER MEDICATION Take 1 capsule by mouth daily. SourSop     gabapentin (NEURONTIN) 100 MG capsule Take 1 capsule (100 mg total) by mouth 3 (three) times daily. (Patient taking differently: Take 100 mg by mouth daily as needed (pain).) 90 capsule 2   Multiple Vitamins-Minerals (MULTIVITAMIN WITH MINERALS) tablet Take 1 tablet by mouth daily. (Patient not taking: Reported on 12/11/2023)     predniSONE (DELTASONE) 10 MG tablet Take 1 tablet (10 mg total) by mouth 3 (three) times daily. (Patient not taking: Reported on 12/11/2023) 42 tablet 0    Physical Exam: Vitals:   12/23/23 0614 12/23/23 0634  BP: (!) 164/108 (!) 150/95  Pulse: 84   Resp: 18   Temp: 98.1 F (36.7 C)   SpO2: 95%    Body mass index is 38.09 kg/m.  General:  AAOX3, well developed and well nourished, NAD Ambulation: abnormal gait pattern, uses no assistive device. Positive ataxic gait pattern. Unable to heel toe ambulate. Inspection: No obvious deformity, scoliosis, kyphosis, loss of lordotic curve. Heart: RRR Lungs: Normal pulmonary effort, no signs of respiratory distress Abdomen: Normal BSX4, non-tender, non-distended, no hepatosplenomegaly. Skin: No abnormal lesions, abrasions, or contusions. Palpation: Tender over spinous processes and neck musculature. AROM: - Fwd Flexion: decreased and painful - Extension: decreased and painful - Lateral bending to left: decreased and painful - Lateral Bending to right: decreased and painful - Rotation to Left: decreased and painful - Rotation to Right: decreased and painful -Shoulder, elbow, and wrists AROM normal and pain free. Dermatomes: UE dermatomes intact to light touch bilaterally. occasional dysethesias to the right upper extremity Myotomes: - shoulder shrug: Left 5/5, Right 5/5 -Shoulder Abduction: Left 5/5, Right 5/5 - Elbow flexion: Left 5/5, Right 5/5 - Elbow extension Left 5/5, Right 5/5 - Finger abduction: Left 5/5, Right 5/5 - finger Adduction/squeeze: Left 5/5, Right 5/5  Reflexes: - UE - 2+ - LE: brisk 3+ bilaterally (patellar/achilles) - Hoffman's: Positive - babinski: positive   Special Tests: -Rhomberg: Positive - Heel to toe walking Positive. Patient has a broad shuffling gait pattern unable to maintain her balance while performing heel-to-toe ambulation  -Spurling: Left: Negative Right: Positive  PV: Extremities warm and well perfused. Distal pulses 2+  bilaterally.  X-Ray impression:  2vCervical: No evidence of degenerative scoliosis, misalignment of the spine, or abnormal rotation. No evidence of spondylolisthesis. Signs of chronic degenerative disease at multi-levels of the cervical spine worst C3-C7. No signs of fractures, bony lesions, or other bony  abnormalities.  MRI Impression: The Cervical MRI showed right lateral cord signal changes at C3-C4 that is compatible with compressive myelopathy. There is also T2 hyperintesinties within right ventral lateral cord at C5-C7. There is multi- level spinal canal stenosis worst at C3-C7 with mild loss of cervical lordosis.  A/P: Jennifer Ellis is a very pleasant 57 year old female who presents with a complaint of neck pain and right arm pain, weakness, and dysesthesia. She reports that this all started back in March of 2024. She does not report an injury. She states that she has been managing her pain with OTC antiinflammatory medications. She has not been to PT and has not had injections for this condition. Imaging shows degenerative disease of the cervical spine with stenosis and cord signal changes on MRI. On exam she is having radicular right arm pain and is myelopathic on exam. She has a positive Hoffman's, positive romberg, and inability to heel-toe walk without issues with her balance (positive ataxic gait pattern). At this time we do recommend that a ACDF C3-C7 is the best course of action for her treatment plan given that she is having radiculopathy and myelopathy. Risks and benefits of surgery were discussed in great detail and all of her questions were addressed. Risks include: Infection, bleeding, death, stroke, paralysis, ongoing or worse pain, need for additional surgery, nonunion, leak of spinal fluid, adjacent segment degeneration requiring additional fusion surgery. Pseudoarthrosis (nonunion)requiring supplemental posterior fixation. Throat pain, swallowing difficulties, hoarseness or change in voice. Primary objective of surgery is to prevent progression of her myelopathy. I did indicate there is a chance that she can improve but I stressed the fact that her primary goal is to prevent her from deteriorating further. Impression: cervical radiculopathy with myelopathy

## 2023-12-23 NOTE — Discharge Instructions (Signed)

## 2023-12-23 NOTE — Brief Op Note (Signed)
12/23/2023  1:48 PM  PATIENT:  Jennifer Ellis  57 y.o. female  PRE-OPERATIVE DIAGNOSIS:  Cervical spondoylotic myelopathy  POST-OPERATIVE DIAGNOSIS:  Cervical spondoylotic myelopathy  PROCEDURE:  Procedure(s) with comments: ANTERIOR CERVICAL DISCECTOMY AND FUSION THREE TO SEVEN (N/A) - 3 c bed  SURGEON:  Surgeons and Role:    Venita Lick, MD - Primary  PHYSICIAN ASSISTANT:   ASSISTANTS: Luther Bradley   ANESTHESIA:   general  EBL:  50 mL   BLOOD ADMINISTERED:none  DRAINS: none   LOCAL MEDICATIONS USED:  MARCAINE     SPECIMEN:  No Specimen  DISPOSITION OF SPECIMEN:  N/A  COUNTS:  YES  TOURNIQUET:  * No tourniquets in log *  DICTATION: .Dragon Dictation  PLAN OF CARE: Admit to inpatient   PATIENT DISPOSITION:  PACU - hemodynamically stable.

## 2023-12-23 NOTE — Anesthesia Procedure Notes (Signed)
Arterial Line Insertion Start/End12/30/2024 7:54 AM, 12/23/2023 8:04 AM Performed by: Val Eagle, MD, anesthesiologist  Patient location: OR. Preanesthetic checklist: patient identified, IV checked, risks and benefits discussed, surgical consent, monitors and equipment checked, pre-op evaluation, timeout performed and anesthesia consent Patient sedated Left, radial was placed Catheter size: 20 G Hand hygiene performed  and maximum sterile barriers used   Attempts: 1 Procedure performed without using ultrasound guided technique. Following insertion, dressing applied. Post procedure assessment: normal and unchanged  Patient tolerated the procedure well with no immediate complications.

## 2023-12-24 DIAGNOSIS — M4712 Other spondylosis with myelopathy, cervical region: Secondary | ICD-10-CM | POA: Diagnosis not present

## 2023-12-24 MED ORDER — SENNOSIDES-DOCUSATE SODIUM 8.6-50 MG PO TABS
1.0000 | ORAL_TABLET | Freq: Two times a day (BID) | ORAL | Status: DC
  Filled 2023-12-24: qty 1

## 2023-12-24 NOTE — Plan of Care (Signed)
 Pt doing well. Pt given D/C instructions with verbal understanding. Rx's were given to the Pt at D/C. Pt's incision is clean and dry with no sign of infection. Pt's IV was removed prior to D/C. Pt has passed gas multiple times since this AM. Pt D/C'd home via wheelchair per MD order. Pt is stable @ D/C and has no other needs at this time. Rosina Rakers, RN

## 2023-12-24 NOTE — Plan of Care (Signed)
 Problem: Education: Goal: Knowledge of General Education information will improve Description: Including pain rating scale, medication(s)/side effects and non-pharmacologic comfort measures 12/24/2023 0016 by Refugio Shasta CROME, RN Outcome: Progressing 12/24/2023 0010 by Refugio Shasta CROME, RN Outcome: Progressing   Problem: Health Behavior/Discharge Planning: Goal: Ability to manage health-related needs will improve 12/24/2023 0016 by Refugio Shasta CROME, RN Outcome: Progressing 12/24/2023 0010 by Refugio Shasta CROME, RN Outcome: Progressing   Problem: Clinical Measurements: Goal: Ability to maintain clinical measurements within normal limits will improve 12/24/2023 0016 by Refugio Shasta CROME, RN Outcome: Progressing 12/24/2023 0010 by Refugio Shasta CROME, RN Outcome: Progressing Goal: Will remain free from infection 12/24/2023 0016 by Refugio Shasta CROME, RN Outcome: Progressing 12/24/2023 0010 by Refugio Shasta CROME, RN Outcome: Progressing Goal: Diagnostic test results will improve 12/24/2023 0016 by Refugio Shasta CROME, RN Outcome: Progressing 12/24/2023 0010 by Refugio Shasta CROME, RN Outcome: Progressing Goal: Respiratory complications will improve 12/24/2023 0016 by Refugio Shasta CROME, RN Outcome: Progressing 12/24/2023 0010 by Refugio Shasta CROME, RN Outcome: Progressing Goal: Cardiovascular complication will be avoided 12/24/2023 0016 by Refugio Shasta CROME, RN Outcome: Progressing 12/24/2023 0010 by Refugio Shasta CROME, RN Outcome: Progressing   Problem: Activity: Goal: Risk for activity intolerance will decrease 12/24/2023 0016 by Refugio Shasta CROME, RN Outcome: Progressing 12/24/2023 0010 by Refugio Shasta CROME, RN Outcome: Progressing   Problem: Nutrition: Goal: Adequate nutrition will be maintained 12/24/2023 0016 by Refugio Shasta CROME, RN Outcome: Progressing 12/24/2023 0010 by Refugio Shasta CROME, RN Outcome: Progressing   Problem: Coping: Goal: Level of anxiety will decrease 12/24/2023  0016 by Refugio Shasta CROME, RN Outcome: Progressing 12/24/2023 0010 by Refugio Shasta CROME, RN Outcome: Progressing   Problem: Elimination: Goal: Will not experience complications related to bowel motility 12/24/2023 0016 by Refugio Shasta CROME, RN Outcome: Progressing 12/24/2023 0010 by Refugio Shasta CROME, RN Outcome: Progressing Goal: Will not experience complications related to urinary retention 12/24/2023 0016 by Refugio Shasta CROME, RN Outcome: Progressing 12/24/2023 0010 by Refugio Shasta CROME, RN Outcome: Progressing   Problem: Pain Management: Goal: General experience of comfort will improve 12/24/2023 0016 by Refugio Shasta CROME, RN Outcome: Progressing 12/24/2023 0010 by Refugio Shasta CROME, RN Outcome: Progressing   Problem: Safety: Goal: Ability to remain free from injury will improve 12/24/2023 0016 by Refugio Shasta CROME, RN Outcome: Progressing 12/24/2023 0010 by Refugio Shasta CROME, RN Outcome: Progressing   Problem: Skin Integrity: Goal: Risk for impaired skin integrity will decrease 12/24/2023 0016 by Refugio Shasta CROME, RN Outcome: Progressing 12/24/2023 0010 by Refugio Shasta CROME, RN Outcome: Progressing   Problem: Education: Goal: Ability to verbalize activity precautions or restrictions will improve 12/24/2023 0016 by Refugio Shasta CROME, RN Outcome: Progressing 12/24/2023 0010 by Refugio Shasta CROME, RN Outcome: Progressing Goal: Knowledge of the prescribed therapeutic regimen will improve 12/24/2023 0016 by Refugio Shasta CROME, RN Outcome: Progressing 12/24/2023 0010 by Refugio Shasta CROME, RN Outcome: Progressing Goal: Understanding of discharge needs will improve 12/24/2023 0016 by Refugio Shasta CROME, RN Outcome: Progressing 12/24/2023 0010 by Refugio Shasta CROME, RN Outcome: Progressing   Problem: Activity: Goal: Ability to avoid complications of mobility impairment will improve 12/24/2023 0016 by Refugio Shasta CROME, RN Outcome: Progressing 12/24/2023 0010 by Refugio Shasta CROME,  RN Outcome: Progressing Goal: Ability to tolerate increased activity will improve 12/24/2023 0016 by Refugio Shasta CROME, RN Outcome: Progressing 12/24/2023 0010 by Refugio Shasta CROME, RN Outcome: Progressing Goal: Will remain free from falls 12/24/2023 0016 by Refugio Shasta CROME, RN Outcome: Progressing 12/24/2023 0010 by Refugio Shasta CROME,  RN Outcome: Progressing   Problem: Bowel/Gastric: Goal: Gastrointestinal status for postoperative course will improve 12/24/2023 0016 by Refugio Shasta CROME, RN Outcome: Progressing 12/24/2023 0010 by Refugio Shasta CROME, RN Outcome: Progressing   Problem: Clinical Measurements: Goal: Ability to maintain clinical measurements within normal limits will improve 12/24/2023 0016 by Refugio Shasta CROME, RN Outcome: Progressing 12/24/2023 0010 by Refugio Shasta CROME, RN Outcome: Progressing Goal: Postoperative complications will be avoided or minimized 12/24/2023 0016 by Refugio Shasta CROME, RN Outcome: Progressing 12/24/2023 0010 by Refugio Shasta CROME, RN Outcome: Progressing Goal: Diagnostic test results will improve 12/24/2023 0016 by Refugio Shasta CROME, RN Outcome: Progressing 12/24/2023 0010 by Refugio Shasta CROME, RN Outcome: Progressing   Problem: Pain Management: Goal: Pain level will decrease 12/24/2023 0016 by Refugio Shasta CROME, RN Outcome: Progressing 12/24/2023 0010 by Refugio Shasta CROME, RN Outcome: Progressing   Problem: Skin Integrity: Goal: Will show signs of wound healing 12/24/2023 0016 by Refugio Shasta CROME, RN Outcome: Progressing 12/24/2023 0010 by Refugio Shasta CROME, RN Outcome: Progressing   Problem: Health Behavior/Discharge Planning: Goal: Identification of resources available to assist in meeting health care needs will improve 12/24/2023 0016 by Refugio Shasta CROME, RN Outcome: Progressing 12/24/2023 0010 by Refugio Shasta CROME, RN Outcome: Progressing   Problem: Bladder/Genitourinary: Goal: Urinary functional status for postoperative course  will improve 12/24/2023 0016 by Refugio Shasta CROME, RN Outcome: Progressing 12/24/2023 0010 by Refugio Shasta CROME, RN Outcome: Progressing   Problem: Education: Goal: Ability to verbalize activity precautions or restrictions will improve 12/24/2023 0016 by Refugio Shasta CROME, RN Outcome: Progressing 12/24/2023 0010 by Refugio Shasta CROME, RN Outcome: Progressing Goal: Knowledge of the prescribed therapeutic regimen will improve 12/24/2023 0016 by Refugio Shasta CROME, RN Outcome: Progressing 12/24/2023 0010 by Refugio Shasta CROME, RN Outcome: Progressing Goal: Understanding of discharge needs will improve 12/24/2023 0016 by Refugio Shasta CROME, RN Outcome: Progressing 12/24/2023 0010 by Refugio Shasta CROME, RN Outcome: Progressing   Problem: Activity: Goal: Ability to avoid complications of mobility impairment will improve 12/24/2023 0016 by Refugio Shasta CROME, RN Outcome: Progressing 12/24/2023 0010 by Refugio Shasta CROME, RN Outcome: Progressing Goal: Ability to tolerate increased activity will improve 12/24/2023 0016 by Refugio Shasta CROME, RN Outcome: Progressing 12/24/2023 0010 by Refugio Shasta CROME, RN Outcome: Progressing Goal: Will remain free from falls 12/24/2023 0016 by Refugio Shasta CROME, RN Outcome: Progressing 12/24/2023 0010 by Refugio Shasta CROME, RN Outcome: Progressing   Problem: Bowel/Gastric: Goal: Gastrointestinal status for postoperative course will improve 12/24/2023 0016 by Refugio Shasta CROME, RN Outcome: Progressing 12/24/2023 0010 by Refugio Shasta CROME, RN Outcome: Progressing   Problem: Clinical Measurements: Goal: Ability to maintain clinical measurements within normal limits will improve 12/24/2023 0016 by Refugio Shasta CROME, RN Outcome: Progressing 12/24/2023 0010 by Refugio Shasta CROME, RN Outcome: Progressing Goal: Postoperative complications will be avoided or minimized 12/24/2023 0016 by Refugio Shasta CROME, RN Outcome: Progressing 12/24/2023 0010 by Refugio Shasta CROME,  RN Outcome: Progressing Goal: Diagnostic test results will improve 12/24/2023 0016 by Refugio Shasta CROME, RN Outcome: Progressing 12/24/2023 0010 by Refugio Shasta CROME, RN Outcome: Progressing   Problem: Pain Management: Goal: Pain level will decrease 12/24/2023 0016 by Refugio Shasta CROME, RN Outcome: Progressing 12/24/2023 0010 by Refugio Shasta CROME, RN Outcome: Progressing   Problem: Skin Integrity: Goal: Will show signs of wound healing 12/24/2023 0016 by Refugio Shasta CROME, RN Outcome: Progressing 12/24/2023 0010 by Refugio Shasta CROME, RN Outcome: Progressing   Problem: Health Behavior/Discharge Planning: Goal: Identification of resources available to assist in meeting health  care needs will improve 12/24/2023 0016 by Refugio Shasta CROME, RN Outcome: Progressing 12/24/2023 0010 by Refugio Shasta CROME, RN Outcome: Progressing   Problem: Bladder/Genitourinary: Goal: Urinary functional status for postoperative course will improve 12/24/2023 0016 by Refugio Shasta CROME, RN Outcome: Progressing 12/24/2023 0010 by Refugio Shasta CROME, RN Outcome: Progressing

## 2023-12-24 NOTE — Plan of Care (Signed)
  Problem: Education: Goal: Knowledge of General Education information will improve Description: Including pain rating scale, medication(s)/side effects and non-pharmacologic comfort measures Outcome: Progressing   Problem: Health Behavior/Discharge Planning: Goal: Ability to manage health-related needs will improve Outcome: Progressing   Problem: Clinical Measurements: Goal: Ability to maintain clinical measurements within normal limits will improve Outcome: Progressing Goal: Will remain free from infection Outcome: Progressing Goal: Diagnostic test results will improve Outcome: Progressing Goal: Respiratory complications will improve Outcome: Progressing Goal: Cardiovascular complication will be avoided Outcome: Progressing   Problem: Activity: Goal: Risk for activity intolerance will decrease Outcome: Progressing   Problem: Nutrition: Goal: Adequate nutrition will be maintained Outcome: Progressing   Problem: Coping: Goal: Level of anxiety will decrease Outcome: Progressing   Problem: Elimination: Goal: Will not experience complications related to bowel motility Outcome: Progressing Goal: Will not experience complications related to urinary retention Outcome: Progressing   Problem: Pain Management: Goal: General experience of comfort will improve Outcome: Progressing   Problem: Safety: Goal: Ability to remain free from injury will improve Outcome: Progressing   Problem: Skin Integrity: Goal: Risk for impaired skin integrity will decrease Outcome: Progressing   Problem: Education: Goal: Ability to verbalize activity precautions or restrictions will improve Outcome: Progressing Goal: Knowledge of the prescribed therapeutic regimen will improve Outcome: Progressing Goal: Understanding of discharge needs will improve Outcome: Progressing   Problem: Activity: Goal: Ability to avoid complications of mobility impairment will improve Outcome: Progressing Goal:  Ability to tolerate increased activity will improve Outcome: Progressing Goal: Will remain free from falls Outcome: Progressing   Problem: Bowel/Gastric: Goal: Gastrointestinal status for postoperative course will improve Outcome: Progressing   Problem: Clinical Measurements: Goal: Ability to maintain clinical measurements within normal limits will improve Outcome: Progressing Goal: Postoperative complications will be avoided or minimized Outcome: Progressing Goal: Diagnostic test results will improve Outcome: Progressing   Problem: Pain Management: Goal: Pain level will decrease Outcome: Progressing   Problem: Skin Integrity: Goal: Will show signs of wound healing Outcome: Progressing   Problem: Health Behavior/Discharge Planning: Goal: Identification of resources available to assist in meeting health care needs will improve Outcome: Progressing   Problem: Bladder/Genitourinary: Goal: Urinary functional status for postoperative course will improve Outcome: Progressing   Problem: Education: Goal: Ability to verbalize activity precautions or restrictions will improve Outcome: Progressing Goal: Knowledge of the prescribed therapeutic regimen will improve Outcome: Progressing Goal: Understanding of discharge needs will improve Outcome: Progressing   Problem: Activity: Goal: Ability to avoid complications of mobility impairment will improve Outcome: Progressing Goal: Ability to tolerate increased activity will improve Outcome: Progressing Goal: Will remain free from falls Outcome: Progressing   Problem: Bowel/Gastric: Goal: Gastrointestinal status for postoperative course will improve Outcome: Progressing   Problem: Clinical Measurements: Goal: Ability to maintain clinical measurements within normal limits will improve Outcome: Progressing Goal: Postoperative complications will be avoided or minimized Outcome: Progressing Goal: Diagnostic test results will  improve Outcome: Progressing   Problem: Pain Management: Goal: Pain level will decrease Outcome: Progressing   Problem: Skin Integrity: Goal: Will show signs of wound healing Outcome: Progressing   Problem: Health Behavior/Discharge Planning: Goal: Identification of resources available to assist in meeting health care needs will improve Outcome: Progressing   Problem: Bladder/Genitourinary: Goal: Urinary functional status for postoperative course will improve Outcome: Progressing

## 2023-12-24 NOTE — Progress Notes (Signed)
    Subjective: Procedure(s) (LRB): ANTERIOR CERVICAL DISCECTOMY AND FUSION THREE TO SEVEN (N/A) 1 Day Post-Op  Patient reports pain as 2 on 0-10 scale.  Reports decreased arm pain reports incisional neck pain   Positive void Negative bowel movement Negative flatus Positive chest pain or shortness of breath  Objective: Vital signs in last 24 hours: Temp:  [98.2 F (36.8 C)-98.9 F (37.2 C)] 98.9 F (37.2 C) (12/31 0432) Pulse Rate:  [81-104] 94 (12/31 0432) Resp:  [14-23] 18 (12/31 0432) BP: (135-178)/(80-114) 143/81 (12/31 0432) SpO2:  [95 %-100 %] 100 % (12/31 0432) Arterial Line BP: (166-177)/(74-86) 177/78 (12/30 1430)  Intake/Output from previous day: 12/30 0701 - 12/31 0700 In: 2059.2 [I.V.:1809.2; IV Piggyback:250] Out: 1250 [Urine:1200; Blood:50]  Labs: No results for input(s): WBC, RBC, HCT, PLT in the last 72 hours. No results for input(s): NA, K, CL, CO2, BUN, CREATININE, GLUCOSE, CALCIUM in the last 72 hours. No results for input(s): LABPT, INR in the last 72 hours.  Physical Exam: Neurologically intact ABD soft Intact pulses distally Incision: dressing C/D/I and no drainage Compartment soft Body mass index is 38.09 kg/m.  Assessment/Plan: Patient stable  Mobilization with physical therapy Encourage incentive spirometry Continue care  Advance diet Up with therapy Possible d/c today if cleared by PT/OT and positive flatus/BM  Donaciano Sprang, MD [facility] [phone]

## 2023-12-24 NOTE — Evaluation (Signed)
 Occupational Therapy Evaluation Patient Details Name: Jennifer Ellis MRN: [id number] DOB: 1966/04/20 Today's Date: 12/24/2023   History of Present Illness 57 yo F s/p ACDF.  PMH includes arthritis and HTN   Clinical Impression   Patient admitted for the procedure above.  Patient is very close to her baseline not needing any assist for ADL, or in hall mobility.  Patient able to climb stairs without assist.  No further needs in the acute setting.  All precautions reviewed with good understanding, questions answered, and recommend follow up as prescribed by MD.         If plan is discharge home, recommend the following: Assist for transportation    Functional Status Assessment  Patient has not had a recent decline in their functional status  Equipment Recommendations  None recommended by OT    Recommendations for Other Services       Precautions / Restrictions Precautions Precautions: None Required Braces or Orthoses: Cervical Brace Cervical Brace: Hard collar;For comfort Restrictions Weight Bearing Restrictions Per Provider Order: No      Mobility Bed Mobility Overal bed mobility: Independent                  Transfers Overall transfer level: Independent                        Balance Overall balance assessment: No apparent balance deficits (not formally assessed)                                         ADL either performed or assessed with clinical judgement   ADL Overall ADL's : At baseline                                             Vision Patient Visual Report: No change from baseline       Perception Perception: Not tested       Praxis Praxis: Not tested       Pertinent Vitals/Pain Pain Assessment Pain Assessment: Faces Faces Pain Scale: Hurts a little bit Pain Location: Incisional Pain Descriptors / Indicators: Aching Pain Intervention(s): Monitored during session     Extremity/Trunk  Assessment Upper Extremity Assessment Upper Extremity Assessment: Overall WFL for tasks assessed   Lower Extremity Assessment Lower Extremity Assessment: Overall WFL for tasks assessed   Cervical / Trunk Assessment Cervical / Trunk Assessment: Neck Surgery   Communication Communication Communication: No apparent difficulties   Cognition Arousal: Alert Behavior During Therapy: WFL for tasks assessed/performed Overall Cognitive Status: Within Functional Limits for tasks assessed                                       General Comments   VSS on RA    Exercises     Shoulder Instructions      Home Living Family/patient expects to be discharged to:: Private residence Living Arrangements: Spouse/significant other Available Help at Discharge: Family;Available 24 hours/day Type of Home: House Home Access: Stairs to enter [employer] of Steps: 1 Entrance Stairs-Rails: None Home Layout: One level     Bathroom Shower/Tub: Chief Strategy Officer: Standard Bathroom Accessibility: Yes How Accessible:  Accessible via walker Home Equipment: None          Prior Functioning/Environment Prior Level of Function : Independent/Modified Independent;Driving                        OT Problem List: Pain      OT Treatment/Interventions:      OT Goals(Current goals can be found in the care plan section) Acute Rehab OT Goals Patient Stated Goal: Return home OT Goal Formulation: With patient Time For Goal Achievement: 12/27/23 Potential to Achieve Goals: Good  OT Frequency:      Co-evaluation              AM-PAC OT 6 Clicks Daily Activity     Outcome Measure Help from another person eating meals?: None Help from another person taking care of personal grooming?: None Help from another person toileting, which includes using toliet, bedpan, or urinal?: None Help from another person bathing (including washing, rinsing, drying)?:  None Help from another person to put on and taking off regular upper body clothing?: None   6 Click Score: 20   End of Session Nurse Communication: Mobility status  Activity Tolerance: Patient tolerated treatment well Patient left: in chair;with call bell/phone within reach  OT Visit Diagnosis: Unsteadiness on feet (R26.81)                Time: 0940-1001 OT Time Calculation (min): 21 min Charges:  OT General Charges $OT Visit: 1 Visit OT Evaluation $OT Eval Moderate Complexity: 1 Mod  12/24/2023  RP, OTR/L  Acute Rehabilitation Services  Office:  [phone]   Jennifer Ellis 12/24/2023, 10:05 AM

## 2023-12-24 NOTE — Progress Notes (Signed)
 PT Cancellation Note  Patient Details Name: Jennifer Ellis MRN: [id number] DOB: 09/23/1966   Cancelled Treatment:    Reason Eval/Treat Not Completed: PT screened, no needs identified, will sign off. Per discussion with OT, pt independent with mobility, able to navigate a flight of stairs without issue, no acute PT needs identified.   Izetta Call, PT, DPT   Acute Rehabilitation Department Office [phone] Secure Chat Communication Preferred   [name] 12/24/2023, 10:53 AM

## 2023-12-24 NOTE — Anesthesia Postprocedure Evaluation (Signed)
Anesthesia Post Note  Patient: Jennifer Ellis  Procedure(s) Performed: ANTERIOR CERVICAL DISCECTOMY AND FUSION THREE TO SEVEN     Patient location during evaluation: PACU Anesthesia Type: General Level of consciousness: awake and alert Pain management: pain level controlled Vital Signs Assessment: post-procedure vital signs reviewed and stable Respiratory status: spontaneous breathing, nonlabored ventilation, respiratory function stable and patient connected to nasal cannula oxygen Cardiovascular status: blood pressure returned to baseline and stable Postop Assessment: no apparent nausea or vomiting Anesthetic complications: no   No notable events documented.            Mariann Barter

## 2023-12-26 NOTE — Discharge Summary (Signed)
 Patient ID: Jennifer Ellis MRN: [id number] DOB/AGE: 1966/01/08 58 y.o.  Admit date: 12/23/2023 Discharge date: 12/26/2023  Admission Diagnoses:  Principal Problem:   Cervical myelopathy [facility])   Discharge Diagnoses:  Principal Problem:   Cervical myelopathy (HCC)  status post Procedure(s): ANTERIOR CERVICAL DISCECTOMY AND FUSION THREE TO SEVEN  Past Medical History:  Diagnosis Date   Arthritis    Hypertension     Surgeries: Procedure(s): ANTERIOR CERVICAL DISCECTOMY AND FUSION THREE TO SEVEN on 12/23/2023   Consultants:   Discharged Condition: Improved  Hospital Course: Jennifer Ellis is an 58 y.o. female who was admitted 12/23/2023 for operative treatment of Cervical myelopathy (HCC). Patient failed conservative treatments (please see the history and physical for the specifics) and had severe unremitting pain that affects sleep, daily activities and work/hobbies. After pre-op clearance, the patient was taken to the operating room on 12/23/2023 and underwent  Procedure(s): ANTERIOR CERVICAL DISCECTOMY AND FUSION THREE TO SEVEN.    Patient was given perioperative antibiotics:  Anti-infectives (From admission, onward)    Start     Dose/Rate Route Frequency Ordered Stop   12/23/23 1645  ceFAZolin  (ANCEF ) IVPB 1 g/50 mL premix        1 g 100 mL/hr over 30 Minutes Intravenous Every 8 hours 12/23/23 1636 12/24/23 0032   12/23/23 0546  ceFAZolin  (ANCEF ) IVPB 2g/100 mL premix  Status:  Discontinued        2 g 200 mL/hr over 30 Minutes Intravenous 30 min pre-op 12/23/23 0546 12/23/23 1716        Patient was given sequential compression devices and early ambulation to prevent DVT.   Patient benefited maximally from hospital stay and there were no complications. At the time of discharge, the patient was urinating/moving their bowels without difficulty, tolerating a regular diet, pain is controlled with oral pain medications and they have been cleared by PT/OT.   Recent vital  signs: No data found.   Recent laboratory studies: No results for input(s): WBC, HGB, HCT, PLT, NA, K, CL, CO2, BUN, CREATININE, GLUCOSE, INR, CALCIUM in the last 72 hours.  Invalid input(s): PT, 2   Discharge Medications:   Allergies as of 12/24/2023       Reactions   Lisinopril Rash   It gave me a rash.         Medication List     STOP taking these medications    predniSONE  10 MG tablet Commonly known as: DELTASONE        TAKE these medications    BLACK CURRANT SEED OIL PO Take 1 capsule by mouth daily.   gabapentin  100 MG capsule Commonly known as: NEURONTIN  Take 1 capsule (100 mg total) by mouth 3 (three) times daily.   methocarbamol  500 MG tablet Commonly known as: ROBAXIN  Take 1 tablet (500 mg total) by mouth every 8 (eight) hours as needed for up to 5 days for muscle spasms.   multivitamin with minerals tablet Take 1 tablet by mouth daily.   olmesartan -hydrochlorothiazide  20-12.5 MG tablet Commonly known as: BENICAR  HCT Take 0.5 tablets by mouth daily.   ondansetron  4 MG tablet Commonly known as: Zofran  Take 1 tablet (4 mg total) by mouth every 8 (eight) hours as needed for nausea or vomiting.   OVER THE COUNTER MEDICATION Take 1 capsule by mouth daily. Sea Moss   OVER THE COUNTER MEDICATION Take 1 capsule by mouth daily. SourSop   oxyCODONE -acetaminophen  10-325 MG tablet Commonly known as: Percocet Take 1 tablet by mouth every 6 (  six) hours as needed for up to 5 days for pain.   VITAMIN C PO Take 1 tablet by mouth daily.   VITAMIN D3 PO Take 1 tablet by mouth daily.        Diagnostic Studies: DG Cervical Spine 2 or 3 views Result Date: 12/23/2023 CLINICAL DATA:  C3-C7 ACDF EXAM: CERVICAL SPINE - 2-3 VIEW COMPARISON:  09/16/2023 FINDINGS: Six fluoroscopic images are obtained during the performance of the procedure and are provided for interpretation only. The initial image demonstrates instrumentation  overlying the C3-4 disc space. Subsequent images demonstrate discectomy and fusion spanning C3 through C7, with anatomic alignment. Please refer to the operative report. Fluoroscopy time: 1 minute 52 seconds, 9.94 mGy IMPRESSION: 1. C3-C7 ACDF as above.  Please refer to operative report. Electronically Signed   By: Ozell Daring M.D.   On: 12/23/2023 17:12   DG C-Arm 1-60 Min-No Report Result Date: 12/23/2023 Fluoroscopy was utilized by the requesting physician.  No radiographic interpretation.   DG C-Arm 1-60 Min-No Report Result Date: 12/23/2023 Fluoroscopy was utilized by the requesting physician.  No radiographic interpretation.   DG C-Arm 1-60 Min-No Report Result Date: 12/23/2023 Fluoroscopy was utilized by the requesting physician.  No radiographic interpretation.   DG C-Arm 1-60 Min-No Report Result Date: 12/23/2023 Fluoroscopy was utilized by the requesting physician.  No radiographic interpretation.   DG C-Arm 1-60 Min-No Report Result Date: 12/23/2023 Fluoroscopy was utilized by the requesting physician.  No radiographic interpretation.   DG C-Arm 1-60 Min-No Report Result Date: 12/23/2023 Fluoroscopy was utilized by the requesting physician.  No radiographic interpretation.    Discharge Instructions     Incentive spirometry RT   Complete by: As directed         Follow-up Information     Burnetta Aures, MD. Schedule an appointment as soon as possible for a visit in 2 week(s).   Specialty: Orthopedic Surgery Why: For suture removal, For wound re-check, If symptoms worsen Contact information: 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  She underwent a 4 level ACDF.  Surgery was uneventful.  Her pain was well-controlled and she was ambulating with improved gait and balance on  postoperative day 1.  Her incision site was clean dry and intact there was no swelling.  She had slight dysphagia and dysphonia but no wound healing complications.  Patient was seen and cleared for home discharge by PT/OT.  She will follow-up with me in 2 weeks for wound check.  All appropriate instructions and medications have been provided.    Signed: Aures JONETTA Burnetta for Dr. Aures Burn[facility] [phone] 12/26/2023, 11:28 AM

## 2023-12-27 ENCOUNTER — Encounter (HOSPITAL_COMMUNITY): Payer: Self-pay | Admitting: Orthopedic Surgery

## 2024-01-14 ENCOUNTER — Encounter (HOSPITAL_COMMUNITY): Payer: Self-pay | Admitting: Orthopedic Surgery

## 2024-05-20 ENCOUNTER — Encounter (HOSPITAL_COMMUNITY): Payer: Self-pay | Admitting: Orthopedic Surgery
